# Patient Record
Sex: Female | Born: 1990 | Race: Black or African American | Hispanic: No | Marital: Single | Smoking: Current some day smoker
Health system: Southern US, Community
[De-identification: ages and names within clinical notes are randomized; demographics above are authoritative.]

## PROBLEM LIST (undated history)

## (undated) ENCOUNTER — Emergency Department (HOSPITAL_COMMUNITY): Admission: EM | Payer: Self-pay | Source: Home / Self Care

## (undated) DIAGNOSIS — E669 Obesity, unspecified: Secondary | ICD-10-CM

## (undated) DIAGNOSIS — G4733 Obstructive sleep apnea (adult) (pediatric): Secondary | ICD-10-CM

## (undated) DIAGNOSIS — R569 Unspecified convulsions: Secondary | ICD-10-CM

## (undated) DIAGNOSIS — E1169 Type 2 diabetes mellitus with other specified complication: Secondary | ICD-10-CM

## (undated) HISTORY — PX: ADENOIDECTOMY: SUR15

## (undated) HISTORY — PX: TONSILLECTOMY: SUR1361

---
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
## 2014-06-23 ENCOUNTER — Emergency Department (HOSPITAL_COMMUNITY)
Admission: EM | Admit: 2014-06-23 | Discharge: 2014-06-23 | Disposition: A | Discharge status: Home / Self Care | Payer: Managed Care, Other (non HMO) | Attending: Emergency Medicine | Admitting: Emergency Medicine

## 2014-06-23 ENCOUNTER — Encounter (HOSPITAL_COMMUNITY): Payer: Self-pay | Admitting: Emergency Medicine

## 2014-06-23 DIAGNOSIS — R0602 Shortness of breath: Secondary | ICD-10-CM | POA: Insufficient documentation

## 2014-06-23 DIAGNOSIS — Z3202 Encounter for pregnancy test, result negative: Secondary | ICD-10-CM | POA: Insufficient documentation

## 2014-06-23 DIAGNOSIS — M546 Pain in thoracic spine: Secondary | ICD-10-CM | POA: Insufficient documentation

## 2014-06-23 DIAGNOSIS — M549 Dorsalgia, unspecified: Secondary | ICD-10-CM | POA: Diagnosis present

## 2014-06-23 LAB — URINALYSIS, ROUTINE W REFLEX MICROSCOPIC
Bilirubin Urine: NEGATIVE
Glucose, UA: NEGATIVE mg/dL
Hgb urine dipstick: NEGATIVE
Ketones, ur: NEGATIVE mg/dL
Leukocytes, UA: NEGATIVE
Nitrite: NEGATIVE
Protein, ur: NEGATIVE mg/dL
Specific Gravity, Urine: 1.018 (ref 1.005–1.030)
Urobilinogen, UA: 0.2 mg/dL (ref 0.0–1.0)
pH: 8 (ref 5.0–8.0)

## 2014-06-23 LAB — POC URINE PREG, ED: Preg Test, Ur: NEGATIVE

## 2014-06-23 MED ORDER — NAPROXEN 500 MG PO TABS: 500.0000 mg | ORAL_TABLET | Freq: Two times a day (BID) | ORAL | Status: DC

## 2014-06-23 MED ORDER — CYCLOBENZAPRINE HCL 10 MG PO TABS: 10.0000 mg | ORAL_TABLET | Freq: Three times a day (TID) | ORAL | Status: DC | PRN

## 2014-06-23 MED ORDER — CYCLOBENZAPRINE HCL 10 MG PO TABS
10.0000 mg | ORAL_TABLET | Freq: Once | ORAL | Status: AC
  Administered 2014-06-23: 10 mg via ORAL
  Filled 2014-06-23: qty 1

## 2014-06-23 NOTE — ED Notes (Signed)
Pt aware of urine sample needed, pt unable to go at this time. 

## 2014-06-23 NOTE — ED Notes (Signed)
Pt sts mid to lower back pain x 2 days that is worse with movement

## 2014-06-23 NOTE — ED Provider Notes (Signed)
CSN: [id number]     Arrival date & time 06/23/14  1756 History  This chart was scribed for Clayton Bibles, PA-C working with Carmin Muskrat, MD by Randa Evens, ED Scribe. This patient was seen in room TR09C/TR09C and the patient's care was started at 6:57 PM.    Chief Complaint  Patient presents with  . Back Pain   The history is provided by the patient. No language interpreter was used.   HPI Comments: Alicia Becker is a 24 y.o. female who presents to the Emergency Department complaining of sharp back pain onset 2 days prior. Pt states that she thought the pain was from over working. Pt states that she has tried a heating pad with no relief. Pt has also tried an epsom salt bath as well with no relief. Pt states that the pain is worse with movement and certain positions. Pt denies fall, heavy lifting or inujy Denies fever, CP cough, abdominal pain, dysuria, urinary frequency, hematuria, vaginal bleeding, vaginal discharge, numbness, weakness or constipation. Denies Hx of cancer or Iv drug use. Pt does report intermittent SOB but relates this to being so overweight.  LMP 05/06/14.  Reports allergy to ibuprofen, tramadol.    History reviewed. No pertinent past medical history. History reviewed. No pertinent past surgical history. History reviewed. No pertinent family history. History  Substance Use Topics  . Smoking status: Never Smoker   . Smokeless tobacco: Not on file  . Alcohol Use: Yes     Comment: occ   OB History    No data available      Review of Systems  Constitutional: Negative for fever.  Respiratory: Negative for cough.   Cardiovascular: Negative for chest pain.  Gastrointestinal: Negative for abdominal pain and constipation.  Genitourinary: Negative for dysuria, frequency, hematuria, vaginal bleeding and vaginal discharge.  Musculoskeletal: Positive for back pain.  Neurological: Negative for weakness and numbness.     Allergies  Review of patient's allergies  indicates no known allergies.  Home Medications   Prior to Admission medications   Not on File   BP 146/91 mmHg  Pulse 110  Temp(Src) 98.7 F (37.1 C) (Oral)  Resp 16  Ht 5\' 3"  (1.6 m)  Wt 305 lb 9.6 oz (138.619 kg)  BMI 54.15 kg/m2  SpO2 99%   Physical Exam  Constitutional: She appears well-developed and well-nourished. No distress.  Morbidly obese.   HENT:  Head: Normocephalic and atraumatic.  Neck: Neck supple.  Pulmonary/Chest: Effort normal.  Abdominal: Soft. She exhibits no distension and no mass. There is no tenderness. There is no rebound and no guarding.  Musculoskeletal:  Spine nontender, no crepitus, or stepoffs. Lower extremities:  Strength 5/5, sensation intact, distal pulses intact.     Neurological: She is alert.  Skin: She is not diaphoretic.  Nursing note and vitals reviewed.   ED Course  Procedures (including critical care time) DIAGNOSTIC STUDIES: Oxygen Saturation is 99% on RA, normal by my interpretation.    COORDINATION OF CARE: 7:23 PM-Discussed treatment plan with pt at bedside and pt agreed to plan.     Labs Review Labs Reviewed  URINALYSIS, ROUTINE W REFLEX MICROSCOPIC (NOT AT [facility])  POC URINE PREG, ED    Imaging Review No results found.   EKG Interpretation None      MDM   Final diagnoses:  Bilateral thoracic back pain   Afebrile, nontoxic patient with mechanical back pain. No red flags.  UA negative.  Upreg negative.  D/C home flexeril, naprosyn.  Discussed result, findings, treatment, and follow up  with patient.  Pt given return precautions.  Pt verbalizes understanding and agrees with plan.       I personally performed the services described in this documentation, which was scribed in my presence. The recorded information has been reviewed and is accurate.      Clayton Bibles, PA-C 06/23/14 2116  Carmin Muskrat, MD 06/24/14 [phone]

## 2014-06-23 NOTE — Discharge Instructions (Signed)
Read the information below.  Use the prescribed medication as directed.  Please discuss all new medications with your pharmacist.  You may return to the Emergency Department at any time for worsening condition or any new symptoms that concern you.   If you develop fevers, loss of control of bowel or bladder, weakness or numbness in your legs, or are unable to walk, return to the ER for a recheck.    Back Pain, Adult Low back pain is very common. About 1 in 5 people have back pain.The cause of low back pain is rarely dangerous. The pain often gets better over time.About half of people with a sudden onset of back pain feel better in just 2 weeks. About 8 in 10 people feel better by 6 weeks.  CAUSES Some common causes of back pain include:  Strain of the muscles or ligaments supporting the spine.  Wear and tear (degeneration) of the spinal discs.  Arthritis.  Direct injury to the back. DIAGNOSIS Most of the time, the direct cause of low back pain is not known.However, back pain can be treated effectively even when the exact cause of the pain is unknown.Answering your caregiver's questions about your overall health and symptoms is one of the most accurate ways to make sure the cause of your pain is not dangerous. If your caregiver needs more information, he or she may order lab work or imaging tests (X-rays or MRIs).However, even if imaging tests show changes in your back, this usually does not require surgery. HOME CARE INSTRUCTIONS For many people, back pain returns.Since low back pain is rarely dangerous, it is often a condition that people can learn to [facility] their own.   Remain active. It is stressful on the back to sit or stand in one place. Do not sit, drive, or stand in one place for more than 30 minutes at a time. Take short walks on level surfaces as soon as pain allows.Try to increase the length of time you walk each day.  Do not stay in bed.Resting more than 1 or 2 days can  delay your recovery.  Do not avoid exercise or work.Your body is made to move.It is not dangerous to be active, even though your back may hurt.Your back will likely heal faster if you return to being active before your pain is gone.  Pay attention to your body when you bend and lift. Many people have less discomfortwhen lifting if they bend their knees, keep the load close to their bodies,and avoid twisting. Often, the most comfortable positions are those that put less stress on your recovering back.  Find a comfortable position to sleep. Use a firm mattress and lie on your side with your knees slightly bent. If you lie on your back, put a pillow under your knees.  Only take over-the-counter or prescription medicines as directed by your caregiver. Over-the-counter medicines to reduce pain and inflammation are often the most helpful.Your caregiver may prescribe muscle relaxant drugs.These medicines help dull your pain so you can more quickly return to your normal activities and healthy exercise.  Put ice on the injured area.  Put ice in a plastic bag.  Place a towel between your skin and the bag.  Leave the ice on for 15-20 minutes, 03-04 times a day for the first 2 to 3 days. After that, ice and heat may be alternated to reduce pain and spasms.  Ask your caregiver about trying back exercises and gentle massage. This may be of some  benefit.  Avoid feeling anxious or stressed.Stress increases muscle tension and can worsen back pain.It is important to recognize when you are anxious or stressed and learn ways to manage it.Exercise is a great option. SEEK MEDICAL CARE IF:  You have pain that is not relieved with rest or medicine.  You have pain that does not improve in 1 week.  You have new symptoms.  You are generally not feeling well. SEEK IMMEDIATE MEDICAL CARE IF:   You have pain that radiates from your back into your legs.  You develop new bowel or bladder control  problems.  You have unusual weakness or numbness in your arms or legs.  You develop nausea or vomiting.  You develop abdominal pain.  You feel faint. Document Released: 12/19/2004 Document Revised: 06/20/2011 Document Reviewed: 04/22/2013 [facility] Patient Information 2015 [place], [place]. This information is not intended to replace advice given to you by your health care provider. Make sure you discuss any questions you have with your health care provider.  Back Exercises Back exercises help treat and prevent back injuries. The goal of back exercises is to increase the strength of your abdominal and back muscles and the flexibility of your back. These exercises should be started when you no longer have back pain. Back exercises include:  Pelvic Tilt. Lie on your back with your knees bent. Tilt your pelvis until the lower part of your back is against the floor. Hold this position 5 to 10 sec and repeat 5 to 10 times.  Knee to Chest. Pull first 1 knee up against your chest and hold for 20 to 30 seconds, repeat this with the other knee, and then both knees. This may be done with the other leg straight or bent, whichever feels better.  Sit-Ups or Curl-Ups. Bend your knees 90 degrees. Start with tilting your pelvis, and do a partial, slow sit-up, lifting your trunk only 30 to 45 degrees off the floor. Take at least 2 to 3 seconds for each sit-up. Do not do sit-ups with your knees out straight. If partial sit-ups are difficult, simply do the above but with only tightening your abdominal muscles and holding it as directed.  Hip-Lift. Lie on your back with your knees flexed 90 degrees. Push down with your feet and shoulders as you raise your hips a couple inches off the floor; hold for 10 seconds, repeat 5 to 10 times.  Back arches. Lie on your stomach, propping yourself up on bent elbows. Slowly press on your hands, causing an arch in your low back. Repeat 3 to 5 times. Any initial stiffness and  discomfort should lessen with repetition over time.  Shoulder-Lifts. Lie face down with arms beside your body. Keep hips and torso pressed to floor as you slowly lift your head and shoulders off the floor. Do not overdo your exercises, especially in the beginning. Exercises may cause you some mild back discomfort which lasts for a few minutes; however, if the pain is more severe, or lasts for more than 15 minutes, do not continue exercises until you see your caregiver. Improvement with exercise therapy for back problems is slow.  See your caregivers for assistance with developing a proper back exercise program. Document Released: 01/27/2004 Document Revised: 03/13/2011 Document Reviewed: 10/20/2010 [facility] Patient Information 2015 [place], [place]. This information is not intended to replace advice given to you by your health care provider. Make sure you discuss any questions you have with your health care provider.

## 2015-05-27 ENCOUNTER — Encounter (HOSPITAL_COMMUNITY): Payer: Self-pay | Admitting: Emergency Medicine

## 2015-05-27 ENCOUNTER — Emergency Department (HOSPITAL_COMMUNITY)
Admission: EM | Admit: 2015-05-27 | Discharge: 2015-05-27 | Disposition: A | Discharge status: Home / Self Care | Payer: BLUE CROSS/BLUE SHIELD | Attending: Emergency Medicine | Admitting: Emergency Medicine

## 2015-05-27 DIAGNOSIS — K0889 Other specified disorders of teeth and supporting structures: Secondary | ICD-10-CM | POA: Insufficient documentation

## 2015-05-27 MED ORDER — OXYCODONE-ACETAMINOPHEN 5-325 MG PO TABS
1.0000 | ORAL_TABLET | Freq: Once | ORAL | Status: AC
  Administered 2015-05-27: 1 via ORAL
  Filled 2015-05-27: qty 1

## 2015-05-27 MED ORDER — PENICILLIN V POTASSIUM 500 MG PO TABS: 500.0000 mg | ORAL_TABLET | Freq: Three times a day (TID) | ORAL | Status: DC

## 2015-05-27 MED ORDER — HYDROCODONE-ACETAMINOPHEN 5-325 MG PO TABS: 1.0000 | ORAL_TABLET | Freq: Four times a day (QID) | ORAL | Status: DC | PRN

## 2015-05-27 NOTE — ED Notes (Signed)
[facility] (5-17) and they referred her to a dentist; however, pt does not have dental insurance.  She was told that she needed an antibiotic but the [facility] Did not give her any medication and told her to take ibuprofen for pain.

## 2015-05-27 NOTE — Discharge Instructions (Signed)
Take penicillin as prescribed until all gone for the infection. Take norco for severe pain. Follow up with a dentist as referred.   Dental Pain Dental pain may be caused by many things, including:  Tooth decay (cavities or caries). Cavities expose the nerve of your tooth to air and hot or cold temperatures. This can cause pain or discomfort.  Abscess or infection. A dental abscess is a collection of infected pus from a bacterial infection in the inner part of the tooth (pulp). It usually occurs at the end of the tooth's root.  Injury.  An unknown reason (idiopathic). Your pain may be mild or severe. It may only occur when:  You are chewing.  You are exposed to hot or cold temperature.  You are eating or drinking sugary foods or beverages, such as soda or candy. Your pain may also be constant. HOME CARE INSTRUCTIONS Watch your dental pain for any changes. The following actions may help to lessen any discomfort that you are feeling:  Take medicines only as directed by your dentist.  If you were prescribed an antibiotic medicine, finish all of it even if you start to feel better.  Keep all follow-up visits as directed by your dentist. This is important.  Do not apply heat to the outside of your face.  Rinse your mouth or gargle with salt water if directed by your dentist. This helps with pain and swelling.  You can make salt water by adding  tsp of salt to 1 cup of warm water.  Apply ice to the painful area of your face:  Put ice in a plastic bag.  Place a towel between your skin and the bag.  Leave the ice on for 20 minutes, 2-3 times per day.  Avoid foods or drinks that cause you pain, such as:  Very hot or very cold foods or drinks.  Sweet or sugary foods or drinks. SEEK MEDICAL CARE IF:  Your pain is not controlled with medicines.  Your symptoms are worse.  You have new symptoms. SEEK IMMEDIATE MEDICAL CARE IF:  You are unable to open your mouth.  You are  having trouble breathing or swallowing.  You have a fever.  Your face, neck, or jaw is swollen.   This information is not intended to replace advice given to you by your health care provider. Make sure you discuss any questions you have with your health care provider.   Document Released: 12/19/2004 Document Revised: 05/05/2014 Document Reviewed: 12/15/2013 Elsevier Interactive Patient Education [employer].

## 2015-05-27 NOTE — ED Provider Notes (Signed)
CSN: [id number]     Arrival date & time 05/27/15  1608 History  By signing my name below, I, Rowan Blase, attest that this documentation has been prepared under the direction and in the presence of non-physician practitioner, Jeannett Senior, PA-C. Electronically Signed: Rowan Blase, Scribe. 05/27/2015. 4:49 PM.   Chief Complaint  Patient presents with  . Dental Pain    The history is provided by the patient. No language interpreter was used.   HPI Comments:  Alicia Becker is a 25 y.o. female who presents to the Emergency Department complaining of lower right tooth pain. Pt reports associated difficulty eating and difficulty sleeping due to pain. [facility]. She was told she needed antibiotics and Ibuprofen; pt states they did not give her a prescription for antibiotics. She has taken Ibuprofen with mild relief, but it caused stomach upset. Pt does not have a PCP currently and is in the process of getting dental insurance.   History reviewed. No pertinent past medical history. History reviewed. No pertinent past surgical history. History reviewed. No pertinent family history. Social History  Substance Use Topics  . Smoking status: Never Smoker   . Smokeless tobacco: Never Used  . Alcohol Use: Yes     Comment: occ   OB History    No data available     Review of Systems  Constitutional: Negative for fever.  HENT: Positive for dental problem.     Allergies  Review of patient's allergies indicates no known allergies.  Home Medications   Prior to Admission medications   Medication Sig Start Date End Date Taking? Authorizing Provider  cyclobenzaprine (FLEXERIL) 10 MG tablet Take 1 tablet (10 mg total) by mouth 3 (three) times daily as needed for muscle spasms (or pain). 06/23/14   Clayton Bibles, PA-C  naproxen (NAPROSYN) 500 MG tablet Take 1 tablet (500 mg total) by mouth 2 (two) times daily with a meal. 06/23/14   Clayton Bibles, PA-C   BP 141/82 mmHg  Pulse 98  Temp(Src) 98.4 F (36.9 C) (Oral)  Resp 18  SpO2 100%  LMP 05/07/2015   Physical Exam  Constitutional: She appears well-developed and well-nourished. No distress.  HENT:  Head: Normocephalic and atraumatic.  Right lower first, second, third molars are broken off to the gumline. There is surrounding gum swelling. There is mild facial swelling as well. Exam consistent with a dental abscess. There is no trismus or swelling under the tongue.  Eyes: Right eye exhibits no discharge. Left eye exhibits no discharge.  Pulmonary/Chest: Effort normal. No respiratory distress.  Neurological: She is alert. Coordination normal.  Skin: No rash noted. She is not diaphoretic.  Psychiatric: She has a normal mood and affect. Her behavior is normal.  Nursing note and vitals reviewed.   ED Course  Procedures  DIAGNOSTIC STUDIES:  Oxygen Saturation is 100% on RA, normal by my interpretation.    COORDINATION OF CARE:  4:48 PM Will start pt on antibiotic and refer to on call dentist. Discussed treatment plan with pt at bedside and pt agreed to plan.  Labs Review Labs Reviewed - No data to display  Imaging Review No results found. I have personally reviewed and evaluated these images and lab results as part of my medical decision-making.   EKG Interpretation None      MDM   Final diagnoses:  Pain, dental   Pt with dental pain. Most consistent with a possible dental  abscess. Will start on antibiotic. Pain medications -10 tab of norco, continue ibuprofen. Penicillin for infection until all gone. Follow up with a dentist.   Filed Vitals:   05/27/15 1621  BP: 141/82  Pulse: 98  Temp: 98.4 F (36.9 C)  TempSrc: Oral  Resp: 18  SpO2: 100%    I personally performed the services described in this documentation, which was scribed in my presence. The recorded information has been reviewed and is accurate.   Jeannett Senior, PA-C 05/27/15  [place], MD 05/27/15 1735

## 2015-07-10 ENCOUNTER — Encounter (HOSPITAL_COMMUNITY): Payer: Self-pay | Admitting: Emergency Medicine

## 2015-07-10 ENCOUNTER — Emergency Department (HOSPITAL_COMMUNITY)
Admission: EM | Admit: 2015-07-10 | Discharge: 2015-07-10 | Disposition: A | Discharge status: Home / Self Care | Payer: BLUE CROSS/BLUE SHIELD | Attending: Emergency Medicine | Admitting: Emergency Medicine

## 2015-07-10 DIAGNOSIS — R51 Headache: Secondary | ICD-10-CM

## 2015-07-10 DIAGNOSIS — H9202 Otalgia, left ear: Secondary | ICD-10-CM

## 2015-07-10 DIAGNOSIS — B349 Viral infection, unspecified: Secondary | ICD-10-CM | POA: Diagnosis not present

## 2015-07-10 DIAGNOSIS — R519 Headache, unspecified: Secondary | ICD-10-CM

## 2015-07-10 MED ORDER — AMOXICILLIN 500 MG PO CAPS: 500.0000 mg | ORAL_CAPSULE | Freq: Three times a day (TID) | ORAL | Status: DC

## 2015-07-10 MED ORDER — GUAIFENESIN 100 MG/5ML PO LIQD: 100.0000 mg | ORAL | Status: DC | PRN

## 2015-07-10 MED ORDER — BUTALBITAL-APAP-CAFFEINE 50-325-40 MG PO TABS: 1.0000 | ORAL_TABLET | Freq: Four times a day (QID) | ORAL | Status: DC | PRN

## 2015-07-10 NOTE — ED Notes (Signed)
Pt reports congestion, sore throat, and headache for 2 weeks.

## 2015-07-10 NOTE — Discharge Instructions (Signed)
Your symptoms are likely caused by a virus.  However, there's a chance that you may also have a superimposed bacterial infection as well.  Please take amoxicillin as prescribed for the full duration as treatment.  Take Fioricet as needed for headache, take guaifenesin for cough and congestion.  Use number below to find a primary care provider for further management of your health.   Viral Infections A virus is a type of germ. Viruses can cause:  Minor sore throats.  Aches and pains.  Headaches.  Runny nose.  Rashes.  Watery eyes.  Tiredness.  Coughs.  Loss of appetite.  Feeling sick to your stomach (nausea).  Throwing up (vomiting).  Watery poop (diarrhea). HOME CARE   Only take medicines as told by your doctor.  Drink enough water and fluids to keep your pee (urine) clear or pale yellow. Sports drinks are a good choice.  Get plenty of rest and eat healthy. Soups and broths with crackers or rice are fine. GET HELP RIGHT AWAY IF:   You have a very bad headache.  You have shortness of breath.  You have chest pain or neck pain.  You have an unusual rash.  You cannot stop throwing up.  You have watery poop that does not stop.  You cannot keep fluids down.  You or your child has a temperature by mouth above 102 F (38.9 C), not controlled by medicine.  Your baby is older than 3 months with a rectal temperature of 102 F (38.9 C) or higher.  Your baby is 42 months old or younger with a rectal temperature of 100.4 F (38 C) or higher. MAKE SURE YOU:   Understand these instructions.  Will watch this condition.  Will get help right away if you are not doing well or get worse.   This information is not intended to replace advice given to you by your health care provider. Make sure you discuss any questions you have with your health care provider.   Document Released: 12/02/2007 Document Revised: 03/13/2011 Document Reviewed: 05/27/2014 Elsevier Interactive  Patient Education [employer].

## 2015-07-10 NOTE — ED Provider Notes (Signed)
CSN: [id number]     Arrival date & time 07/10/15  1106 History   First MD Initiated Contact with Patient 07/10/15 1136     Chief Complaint  Patient presents with  . Nasal Congestion     (Consider location/radiation/quality/duration/timing/severity/associated sxs/prior Treatment) HPI   25 year old female presents with cold sxs.  Patient states for the past 2 weeks she has been having recurrent frontal headache, sinus congestion, ear discomfort, sore throat, swollen uvula, generalized body aches, and not feeling well. She has been using numerous over-the-counter medication including TheraFlu, Alka-Seltzer, and other home remedy without adequate relief. Her headache is affecting her daily living. 2 days ago she reported having some nausea vomiting diarrhea which has since resolved. She does endorse a cough productive with yellow sputum. She denies any neck stiffness, chest pain, shortness of breath, or rash. She is a nonsmoker. Denies any recent sick contact.  History reviewed. No pertinent past medical history. History reviewed. No pertinent past surgical history. No family history on file. Social History  Substance Use Topics  . Smoking status: Never Smoker   . Smokeless tobacco: Never Used  . Alcohol Use: Yes     Comment: occ   OB History    No data available     Review of Systems  All other systems reviewed and are negative.     Allergies  Review of patient's allergies indicates no known allergies.  Home Medications   Prior to Admission medications   Medication Sig Start Date End Date Taking? Authorizing Provider  cyclobenzaprine (FLEXERIL) 10 MG tablet Take 1 tablet (10 mg total) by mouth 3 (three) times daily as needed for muscle spasms (or pain). 06/23/14   Clayton Bibles, PA-C  HYDROcodone-acetaminophen (NORCO) 5-325 MG tablet Take 1 tablet by mouth every 6 (six) hours as needed for moderate pain. 05/27/15   Tatyana Kirichenko, PA-C  naproxen (NAPROSYN) 500 MG tablet Take 1  tablet (500 mg total) by mouth 2 (two) times daily with a meal. 06/23/14   Clayton Bibles, PA-C  penicillin v potassium (VEETID) 500 MG tablet Take 1 tablet (500 mg total) by mouth 3 (three) times daily. 05/27/15   Tatyana Kirichenko, PA-C   BP 148/132 mmHg  Pulse 102  Temp(Src) 98.3 F (36.8 C) (Oral)  Resp 18  SpO2 99%  LMP 06/03/2015 Physical Exam  Constitutional: She appears well-developed and well-nourished. No distress.  HENT:  Head: Atraumatic.  Ears: Erythema noted to left TM but nonbulging. Right TM is normal Nose: Rhinorrhea with boggy turbinates Throat: Uvula is mildly edematous but midline. No tonsillar enlargement or exudates, no trismus  Eyes: Conjunctivae are normal.  Neck: Normal range of motion. Neck supple.  No nuchal rigidity  Cardiovascular: Normal rate and regular rhythm.   Pulmonary/Chest: Effort normal and breath sounds normal. She has no wheezes.  Abdominal: Soft.  Lymphadenopathy:    She has cervical adenopathy.  Neurological: She is alert.  Skin: No rash noted.  Psychiatric: She has a normal mood and affect.  Nursing note and vitals reviewed.   ED Course  Procedures (including critical care time) Labs Review Labs Reviewed - No data to display  Imaging Review No results found. I have personally reviewed and evaluated these images and lab results as part of my medical decision-making.   EKG Interpretation None      MDM   Final diagnoses:  Otalgia, left  Bad headache  Viral syndrome    BP 148/132 mmHg  Pulse 102  Temp(Src) 98.3 F (36.8 C) (  Oral)  Resp 18  SpO2 99%  LMP 06/03/2015   11:51 AM Patient here with cold symptoms likely viral in etiology however it has been ongoing for 2 weeks therefore I plan to treat for potential superimposed bacterial infection. Patient discharged with amoxicillin, Fioricet for headache, and guaifenesin for her congestion. I encouraged patient to find a primary care provider for further management of her  health.  Domenic Moras, PA-C 07/10/15 [place], MD 07/10/15 [phone]

## 2015-09-20 ENCOUNTER — Emergency Department (HOSPITAL_COMMUNITY): Payer: BLUE CROSS/BLUE SHIELD

## 2015-09-20 ENCOUNTER — Emergency Department (HOSPITAL_COMMUNITY)
Admission: EM | Admit: 2015-09-20 | Discharge: 2015-09-20 | Disposition: A | Discharge status: Home / Self Care | Payer: BLUE CROSS/BLUE SHIELD | Attending: Emergency Medicine | Admitting: Emergency Medicine

## 2015-09-20 ENCOUNTER — Encounter (HOSPITAL_COMMUNITY): Payer: Self-pay | Admitting: Vascular Surgery

## 2015-09-20 DIAGNOSIS — R079 Chest pain, unspecified: Secondary | ICD-10-CM | POA: Insufficient documentation

## 2015-09-20 DIAGNOSIS — Z5321 Procedure and treatment not carried out due to patient leaving prior to being seen by health care provider: Secondary | ICD-10-CM | POA: Insufficient documentation

## 2015-09-20 LAB — BASIC METABOLIC PANEL
Anion gap: 5 (ref 5–15)
BUN: 11 mg/dL (ref 6–20)
CO2: 30 mmol/L (ref 22–32)
Calcium: 9.8 mg/dL (ref 8.9–10.3)
Chloride: 102 mmol/L (ref 101–111)
Creatinine, Ser: 0.94 mg/dL (ref 0.44–1.00)
GFR calc non Af Amer: >60 mL/min (ref >60)
Glucose, Bld: 95 mg/dL (ref 65–99)
POTASSIUM: 4.2 mmol/L (ref 3.5–5.1)
SODIUM: 137 mmol/L (ref 135–145)

## 2015-09-20 LAB — CBC
HEMATOCRIT: 35.9 % — AB (ref 36.0–46.0)
Hemoglobin: 10.5 g/dL — ABNORMAL LOW (ref 12.0–15.0)
MCH: 22.1 pg — ABNORMAL LOW (ref 26.0–34.0)
MCHC: 29.2 g/dL — ABNORMAL LOW (ref 30.0–36.0)
MCV: 75.4 fL — AB (ref 78.0–100.0)
Platelets: 340 10*3/uL (ref 150–400)
RBC: 4.76 MIL/uL (ref 3.87–5.11)
RDW: 18.1 % — ABNORMAL HIGH (ref 11.5–15.5)
WBC: 8.2 10*3/uL (ref 4.0–10.5)

## 2015-09-20 LAB — I-STAT TROPONIN, ED: Troponin i, poc: 0 ng/mL (ref 0.00–0.08)

## 2015-09-20 NOTE — ED Triage Notes (Signed)
Pt reports to the ED for eval of CP and back pain. Pt reports she has been having this x 1 month. She reports she was dx with PNA and had to be admitted last time she had these symptoms. She has some associated SOB. Pt also reports dry cough and nasal congestion.

## 2015-09-20 NOTE — ED Notes (Signed)
Pts name called for a room no answer 

## 2016-02-19 ENCOUNTER — Emergency Department (HOSPITAL_COMMUNITY)
Admission: EM | Admit: 2016-02-19 | Discharge: 2016-02-19 | Disposition: A | Discharge status: Home / Self Care | Payer: BLUE CROSS/BLUE SHIELD | Attending: Emergency Medicine | Admitting: Emergency Medicine

## 2016-02-19 ENCOUNTER — Encounter (HOSPITAL_COMMUNITY): Payer: Self-pay | Admitting: *Deleted

## 2016-02-19 DIAGNOSIS — Z79899 Other long term (current) drug therapy: Secondary | ICD-10-CM | POA: Insufficient documentation

## 2016-02-19 DIAGNOSIS — L509 Urticaria, unspecified: Secondary | ICD-10-CM

## 2016-02-19 DIAGNOSIS — L500 Allergic urticaria: Secondary | ICD-10-CM | POA: Insufficient documentation

## 2016-02-19 DIAGNOSIS — K0889 Other specified disorders of teeth and supporting structures: Secondary | ICD-10-CM | POA: Insufficient documentation

## 2016-02-19 MED ORDER — FAMOTIDINE 20 MG PO TABS
40.0000 mg | ORAL_TABLET | Freq: Once | ORAL | Status: AC
  Administered 2016-02-19: 40 mg via ORAL
  Filled 2016-02-19: qty 2

## 2016-02-19 MED ORDER — DIPHENHYDRAMINE HCL 50 MG/ML IJ SOLN
50.0000 mg | Freq: Once | INTRAMUSCULAR | Status: AC
  Administered 2016-02-19: 50 mg via INTRAMUSCULAR
  Filled 2016-02-19: qty 1

## 2016-02-19 MED ORDER — METHYLPREDNISOLONE SODIUM SUCC 125 MG IJ SOLR
125.0000 mg | Freq: Once | INTRAMUSCULAR | Status: AC
  Administered 2016-02-19: 125 mg via INTRAMUSCULAR
  Filled 2016-02-19: qty 2

## 2016-02-19 MED ORDER — DIPHENHYDRAMINE HCL 25 MG PO CAPS: 50.0000 mg | ORAL_CAPSULE | Freq: Four times a day (QID) | ORAL | 0 refills | Status: DC | PRN

## 2016-02-19 MED ORDER — PREDNISONE 20 MG PO TABS: 60.0000 mg | ORAL_TABLET | Freq: Every day | ORAL | 0 refills | Status: DC

## 2016-02-19 MED ORDER — CLINDAMYCIN HCL 300 MG PO CAPS: 300.0000 mg | ORAL_CAPSULE | Freq: Three times a day (TID) | ORAL | 0 refills | Status: DC

## 2016-02-19 NOTE — Discharge Instructions (Signed)
Please stop your penicillin and hydrocodone as these may be causing you to have an allergic reaction. We are starting you on clindamycin which you can start later today for your dental pain. We have given outpatient follow-up with multiple dentists. You may alternate between Tylenol 1000 mg every 6 hours as needed for pain and ibuprofen 800 mg every 8 hours as needed for pain. Please take Benadryl 50 mg every 6 hours as needed for rash, itching. Please continue your steroids. You may start your steroids tomorrow morning as you have received a dose here in the emergency department.

## 2016-02-19 NOTE — ED Provider Notes (Signed)
TIME SEEN: 6:40 AM  CHIEF COMPLAINT: Urticaria  HPI: Pt is a 26 y.o. female who presents emergency room with hives that started last night. Patient reports she was recently started on hydrocodone liquid and penicillin for dental abscess. Denies any other new medications, soaps, lotions, detergents, foods, pet exposures. No wheezing, difficulty breathing, swallowing or speaking. No lip or tongue swelling. States she has had amoxicillin before without issues but has never had hydrocodone that she can remember.  ROS: See HPI Constitutional: no fever  Eyes: no drainage  ENT: no runny nose   Cardiovascular:  no chest pain  Resp: no SOB  GI: no vomiting GU: no dysuria Integumentary: no rash  Allergy: hives  Musculoskeletal: no leg swelling  Neurological: no slurred speech ROS otherwise negative  PAST MEDICAL HISTORY/PAST SURGICAL HISTORY:  No past medical history on file.  MEDICATIONS:  Prior to Admission medications   Medication Sig Start Date End Date Taking? Authorizing Provider  amoxicillin (AMOXIL) 500 MG capsule Take 1 capsule (500 mg total) by mouth 3 (three) times daily. 07/10/15   Domenic Moras, PA-C  butalbital-acetaminophen-caffeine (FIORICET) [phone] MG tablet Take 1-2 tablets by mouth every 6 (six) hours as needed for headache. 07/10/15 07/09/16  Domenic Moras, PA-C  cyclobenzaprine (FLEXERIL) 10 MG tablet Take 1 tablet (10 mg total) by mouth 3 (three) times daily as needed for muscle spasms (or pain). 06/23/14   Clayton Bibles, PA-C  guaiFENesin (ROBITUSSIN) 100 MG/5ML liquid Take 5-10 mLs (100-200 mg total) by mouth every 4 (four) hours as needed for cough or congestion. 07/10/15   Domenic Moras, PA-C  HYDROcodone-acetaminophen (NORCO) 5-325 MG tablet Take 1 tablet by mouth every 6 (six) hours as needed for moderate pain. 05/27/15   Tatyana Kirichenko, PA-C  naproxen (NAPROSYN) 500 MG tablet Take 1 tablet (500 mg total) by mouth 2 (two) times daily with a meal. 06/23/14   Clayton Bibles, PA-C   penicillin v potassium (VEETID) 500 MG tablet Take 1 tablet (500 mg total) by mouth 3 (three) times daily. 05/27/15   Tatyana Kirichenko, PA-C    ALLERGIES:  No Known Allergies  SOCIAL HISTORY:  Social History  Substance Use Topics  . Smoking status: Never Smoker  . Smokeless tobacco: Never Used  . Alcohol use Yes     Comment: occ    FAMILY HISTORY: No family history on file.  EXAM: BP 123/88 (BP Location: Left Arm)   Pulse 99   Resp 16   LMP 02/10/2016   SpO2 100%  CONSTITUTIONAL: Alert and oriented and responds appropriately to questions. Well-appearing; well-nourished HEAD: Normocephalic EYES: Conjunctivae clear, PERRL, EOMI ENT: normal nose; no rhinorrhea; moist mucous membranes; No pharyngeal erythema or petechiae, no tonsillar hypertrophy or exudate, no uvular deviation, no unilateral swelling, no trismus or drooling, no muffled voice, normal phonation, no stridor, multiple dental caries present, no drainable dental abscess noted, no Ludwig's angina, tongue sits flat in the bottom of the mouth, no angioedema, no facial erythema or warmth, no facial swelling; no pain with movement of the neck NECK: Supple, no meningismus, no nuchal rigidity, no LAD  CARD: RRR; S1 and S2 appreciated; no murmurs, no clicks, no rubs, no gallops RESP: Normal chest excursion without splinting or tachypnea; breath sounds clear and equal bilaterally; no wheezes, no rhonchi, no rales, no hypoxia or respiratory distress, speaking full sentences ABD/GI: Normal bowel sounds; non-distended; soft, non-tender, no rebound, no guarding, no peritoneal signs, no hepatosplenomegaly BACK:  The back appears normal and is non-tender to palpation,  there is no CVA tenderness EXT: Normal ROM in all joints; non-tender to palpation; no edema; normal capillary refill; no cyanosis, no calf tenderness or swelling    SKIN: Normal color for age and race; warm; diffuse urticaria, no petechia or purpura, no blisters or  desquamation, no rash involving her palms are mucous membranes NEURO: Moves all extremities equally, sensation to light touch intact diffusely, cranial nerves II through XII intact, normal speech PSYCH: The patient's mood and manner are appropriate. Grooming and personal hygiene are appropriate.  MEDICAL DECISION MAKING: Patient here with diffuse urticaria but no other sign of allergic reaction. No difficulty breathing, swallowing or speaking. No hypotension. We'll treat symptomatically with Benadryl, steroids, Pepcid. We'll discharge home with prescriptions for the same. Also complaining of dental abscess. Has multiple dental caries but no drainable abscess or Ludwig's angina. Have advised her to avoid penicillin and hydrocodone and will transition her to clindamycin. Recommend alternating Tylenol and ibuprofen for pain and will provide with PCP and dental follow-up. Discussed return precautions. She is comfortable with this plan.  At this time, I do not feel there is any life-threatening condition present. I have reviewed and discussed all results (EKG, imaging, lab, urine as appropriate) and exam findings with patient/family. I have reviewed nursing notes and appropriate previous records.  I feel the patient is safe to be discharged home without further emergent workup and can continue workup as an outpatient as needed. Discussed usual and customary return precautions. Patient/family verbalize understanding and are comfortable with this plan.  Outpatient follow-up has been provided. All questions have been answered.      [place], DO 02/19/16 [phone]

## 2016-02-19 NOTE — ED Triage Notes (Signed)
Patient with rash all over body hives all over body.  The only thing new is Hydrocodon liquid given by her MD

## 2016-04-25 ENCOUNTER — Encounter (HOSPITAL_COMMUNITY): Payer: Self-pay | Admitting: Emergency Medicine

## 2016-04-25 ENCOUNTER — Emergency Department (HOSPITAL_COMMUNITY): Payer: BLUE CROSS/BLUE SHIELD

## 2016-04-25 ENCOUNTER — Emergency Department (HOSPITAL_COMMUNITY)
Admission: EM | Admit: 2016-04-25 | Discharge: 2016-04-25 | Disposition: A | Discharge status: Home / Self Care | Payer: BLUE CROSS/BLUE SHIELD | Attending: Emergency Medicine | Admitting: Emergency Medicine

## 2016-04-25 DIAGNOSIS — S8252XA Displaced fracture of medial malleolus of left tibia, initial encounter for closed fracture: Secondary | ICD-10-CM | POA: Diagnosis not present

## 2016-04-25 DIAGNOSIS — Y999 Unspecified external cause status: Secondary | ICD-10-CM | POA: Diagnosis not present

## 2016-04-25 DIAGNOSIS — Z79899 Other long term (current) drug therapy: Secondary | ICD-10-CM | POA: Diagnosis not present

## 2016-04-25 DIAGNOSIS — Y92410 Unspecified street and highway as the place of occurrence of the external cause: Secondary | ICD-10-CM | POA: Insufficient documentation

## 2016-04-25 DIAGNOSIS — Y939 Activity, unspecified: Secondary | ICD-10-CM | POA: Diagnosis not present

## 2016-04-25 DIAGNOSIS — S99912A Unspecified injury of left ankle, initial encounter: Secondary | ICD-10-CM | POA: Diagnosis present

## 2016-04-25 DIAGNOSIS — S82892A Other fracture of left lower leg, initial encounter for closed fracture: Secondary | ICD-10-CM

## 2016-04-25 MED ORDER — DICLOFENAC SODIUM 50 MG PO TBEC: 50.0000 mg | DELAYED_RELEASE_TABLET | Freq: Two times a day (BID) | ORAL | 0 refills | Status: DC

## 2016-04-25 MED ORDER — OXYCODONE-ACETAMINOPHEN 5-325 MG PO TABS
1.0000 | ORAL_TABLET | Freq: Once | ORAL | Status: AC
  Administered 2016-04-25: 1 via ORAL
  Filled 2016-04-25: qty 1

## 2016-04-25 MED ORDER — HYDROCODONE-ACETAMINOPHEN 5-325 MG PO TABS: 1.0000 | ORAL_TABLET | Freq: Four times a day (QID) | ORAL | 0 refills | Status: DC | PRN
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
## 2016-04-25 NOTE — Progress Notes (Signed)
Orthopedic Tech Progress Note Patient Details:  Alicia Becker 1990-10-17 [id number]  Ortho Devices Type of Ortho Device: CAM walker Ortho Device/Splint Location: LLE Ortho Device/Splint Interventions: Ordered, Application   Braulio Bosch 04/25/2016, 10:59 PM

## 2016-04-25 NOTE — ED Notes (Signed)
Patient transported to X-ray 

## 2016-05-02 ENCOUNTER — Encounter (HOSPITAL_COMMUNITY): Payer: Self-pay | Admitting: Emergency Medicine

## 2016-05-02 ENCOUNTER — Emergency Department (HOSPITAL_COMMUNITY)
Admission: EM | Admit: 2016-05-02 | Discharge: 2016-05-02 | Disposition: A | Discharge status: Home / Self Care | Payer: BLUE CROSS/BLUE SHIELD | Attending: Physician Assistant | Admitting: Physician Assistant

## 2016-05-02 DIAGNOSIS — Z79899 Other long term (current) drug therapy: Secondary | ICD-10-CM | POA: Diagnosis not present

## 2016-05-02 DIAGNOSIS — L02414 Cutaneous abscess of left upper limb: Secondary | ICD-10-CM | POA: Insufficient documentation

## 2016-05-02 DIAGNOSIS — L02412 Cutaneous abscess of left axilla: Secondary | ICD-10-CM

## 2016-05-02 MED ORDER — SULFAMETHOXAZOLE-TRIMETHOPRIM 800-160 MG PO TABS: 1.0000 | ORAL_TABLET | Freq: Two times a day (BID) | ORAL | 0 refills | Status: AC

## 2016-05-02 MED ORDER — LIDOCAINE-EPINEPHRINE (PF) 2 %-1:200000 IJ SOLN
20.0000 mL | Freq: Once | INTRAMUSCULAR | Status: AC
  Administered 2016-05-02: 20 mL
  Filled 2016-05-02: qty 20

## 2016-05-02 MED ORDER — IBUPROFEN 800 MG PO TABS: 800.0000 mg | ORAL_TABLET | Freq: Three times a day (TID) | ORAL | 0 refills | Status: DC

## 2016-05-02 NOTE — ED Provider Notes (Signed)
[facility] DEPT Provider Note   CSN: [id number] Arrival date & time: 05/02/16  1237  By signing my name below, I, [place], attest that this documentation has been prepared under the direction and in the presence of Lorin Glass, PA-C. Electronically Signed: Ethelle Lyon Long, Scribe. 05/02/2016. 1:24 PM.  History   Chief Complaint Chief Complaint  Patient presents with  . Abscess   The history is provided by the patient and medical records. No language interpreter was used.   HPI Comments: Alicia Becker is a 26 y.o. female with no pertinent PMHx, who presents to the Emergency Department complaining of a moderate, gradually worsening area of pain and swelling to the left axilla onset one week ago. Pt reports this area has gradually worsened over the past week with the area "busting open" spontaneously two days ago. The area has had purulent drainage followed by clear/ bloody drainage for the past two days. She reports a h/o abscess to the same area after using a deodorant that caused her irritation. She did not try anything for relief of her symptoms PTA. No h/o CAD/MI or other significant coronary events. Pt states pain is exacerbated with palpation and direct pressure. Denies fever, chills. Pt is a nonsmoker and does not currently have a PCP.    History reviewed. No pertinent past medical history.  There are no active problems to display for this patient.  History reviewed. No pertinent surgical history.  OB History    No data available     Home Medications    Prior to Admission medications   Medication Sig Start Date End Date Taking? Authorizing Provider  diclofenac (VOLTAREN) 50 MG EC tablet Take 1 tablet (50 mg total) by mouth 2 (two) times daily. 04/25/16   Hope Bunnie Pion, NP  diphenhydrAMINE (BENADRYL) 25 mg capsule Take 2 capsules (50 mg total) by mouth every 6 (six) hours as needed. 02/19/16   Kristen N Ward, DO  HYDROcodone-acetaminophen (NORCO) 5-325 MG tablet  Take 1 tablet by mouth every 6 (six) hours as needed. 04/25/16   Hope Bunnie Pion, NP  ibuprofen (ADVIL,MOTRIN) 800 MG tablet Take 1 tablet (800 mg total) by mouth 3 (three) times daily. 05/02/16   Lorin Glass, PA-C  predniSONE (DELTASONE) 20 MG tablet Take 3 tablets (60 mg total) by mouth daily. 02/19/16   Kristen N Ward, DO  sulfamethoxazole-trimethoprim (BACTRIM DS,SEPTRA DS) 800-160 MG tablet Take 1 tablet by mouth 2 (two) times daily. 05/02/16 05/09/16  Lorin Glass, PA-C   Family History History reviewed. No pertinent family history.  Social History Social History  Substance Use Topics  . Smoking status: Never Smoker  . Smokeless tobacco: Never Used  . Alcohol use Yes     Comment: occ   Allergies   Patient has no known allergies.  Review of Systems Review of Systems  Constitutional: Negative for chills and fever.  Respiratory: Positive for shortness of breath.   Gastrointestinal: Negative for nausea and vomiting.  Skin: Positive for wound.  Neurological: Negative for numbness.    Physical Exam Updated Vital Signs BP (!) 129/109 (BP Location: Right Arm)   Pulse 92   Temp 98.1 F (36.7 C) (Oral)   Resp 16   LMP 04/18/2016 (Approximate)   SpO2 98%   Physical Exam  Constitutional: She is oriented to person, place, and time. She appears well-developed and well-nourished.  HENT:  Head: Normocephalic.  Eyes: Conjunctivae are normal.  Cardiovascular: Normal rate.   Pulmonary/Chest: Effort  normal and breath sounds normal. No respiratory distress.  Abdominal: She exhibits no distension.  Musculoskeletal: Normal range of motion.  Neurological: She is alert and oriented to person, place, and time.  Skin: Skin is warm and dry. There is erythema.  2cm open wound with red beefy wound bed. Inferior to that, she has a warm tender 1.5cm area of firmness with surrounding fluctuance consistent with abscess. She has multiple subdermal masses that are consistent with noninfected/  nonirritated cysts.  Exam during Incision and drainage after local infiltration of anesthesia revealed that the second, smaller abscess was in fact a loculated tract that had walled off from the initial abscess.     Psychiatric: She has a normal mood and affect.  Nursing note and vitals reviewed.  When entering patient room she was napping, noted loud snoring, easily arousable.  ED Treatments / Results  DIAGNOSTIC STUDIES:  Oxygen Saturation is 99% on RA, normal by my interpretation.    COORDINATION OF CARE:  1:24 PM Discussed treatment plan with pt at bedside including I&D and pt agreed to plan.  Labs (all labs ordered are listed, but only abnormal results are displayed) Labs Reviewed - No data to display  EKG  EKG Interpretation None       Radiology No results found.  Procedures .Marland KitchenIncision and Drainage Date/Time: 05/02/2016 3:13 PM Performed by: Lorin Glass Authorized by: Lorin Glass   Consent:    Consent obtained:  Verbal   Consent given by:  Patient   Risks discussed:  Bleeding, pain, incomplete drainage and infection   Alternatives discussed:  No treatment, delayed treatment, alternative treatment and referral Location:    Type:  Abscess   Size:  1.5cm   Location:  Upper extremity   Upper extremity location:  Arm   Arm location:  L upper arm Pre-procedure details:    Skin preparation:  Chloraprep Anesthesia (see MAR for exact dosages):    Anesthesia method:  Topical application and local infiltration   Local anesthetic:  Lidocaine 2% WITH epi Procedure type:    Complexity:  Complex Procedure details:    Incision types:  Stab incision   Incision depth:  Subcutaneous   Scalpel blade:  11   Wound management:  Irrigated with saline   Drainage:  Purulent and bloody   Drainage amount:  Moderate   Wound treatment:  Wound left open   Packing materials:  None Post-procedure details:    Patient tolerance of procedure:  Tolerated well, no  immediate complications        Medications Ordered in ED Medications  lidocaine-EPINEPHrine (XYLOCAINE W/EPI) 2 %-1:200000 (PF) injection 20 mL (20 mLs Infiltration Given 05/02/16 1520)     Initial Impression / Assessment and Plan / ED Course  I have reviewed the triage vital signs and the nursing notes.  Pertinent labs & imaging results that were available during my care of the patient were reviewed by me and considered in my medical decision making (see chart for details).     Patient with skin abscess. Incision and drainage performed in the ED today.  Abscess was not large enough to warrant packing or drain placement. Wound recheck in 2 days. Supportive care and return precautions discussed.  Pt sent home with Bactrim as there appear to be connected tracts/loculated abscesses.. The patient appears reasonably screened and/or stabilized for discharge and I doubt any other emergent medical condition requiring further screening, evaluation, or treatment in the ED prior to discharge.  Patient was advised  to discuss a outpatient sleep apnea evaluation with her PCP.   Final Clinical Impressions(s) / ED Diagnoses   Final diagnoses:  Abscess of axilla, left    New Prescriptions Discharge Medication List as of 05/02/2016  3:50 PM    START taking these medications   Details  ibuprofen (ADVIL,MOTRIN) 800 MG tablet Take 1 tablet (800 mg total) by mouth 3 (three) times daily., Starting Tue 05/02/2016, Print    sulfamethoxazole-trimethoprim (BACTRIM DS,SEPTRA DS) 800-160 MG tablet Take 1 tablet by mouth 2 (two) times daily., Starting Tue 05/02/2016, Until Tue 05/09/2016, Print        I personally performed the services described in this documentation, which was scribed in my presence. The recorded information has been reviewed and is accurate.     Lorin Glass, PA-C 05/02/16 [place], MD 05/02/16 1651

## 2016-05-02 NOTE — Discharge Instructions (Signed)
Please have a provider re-evaluate your wound in 48 hours.

## 2016-05-02 NOTE — ED Triage Notes (Signed)
Pt sts abscess to left axillary area with hx of same; pt sts some purulent drainage

## 2016-05-02 NOTE — ED Notes (Signed)
Pt stable, understands discharge instructions, and reasons for return.   

## 2016-05-27 ENCOUNTER — Encounter (HOSPITAL_COMMUNITY): Payer: Self-pay

## 2016-05-27 ENCOUNTER — Emergency Department (HOSPITAL_COMMUNITY)
Admission: EM | Admit: 2016-05-27 | Discharge: 2016-05-27 | Disposition: A | Discharge status: Home / Self Care | Payer: BLUE CROSS/BLUE SHIELD | Attending: Emergency Medicine | Admitting: Emergency Medicine

## 2016-05-27 DIAGNOSIS — S8252XA Displaced fracture of medial malleolus of left tibia, initial encounter for closed fracture: Secondary | ICD-10-CM | POA: Insufficient documentation

## 2016-05-27 DIAGNOSIS — Y929 Unspecified place or not applicable: Secondary | ICD-10-CM | POA: Diagnosis not present

## 2016-05-27 DIAGNOSIS — S8252XD Displaced fracture of medial malleolus of left tibia, subsequent encounter for closed fracture with routine healing: Secondary | ICD-10-CM

## 2016-05-27 DIAGNOSIS — Y999 Unspecified external cause status: Secondary | ICD-10-CM | POA: Diagnosis not present

## 2016-05-27 DIAGNOSIS — Y939 Activity, unspecified: Secondary | ICD-10-CM | POA: Diagnosis not present

## 2016-05-27 DIAGNOSIS — Z79899 Other long term (current) drug therapy: Secondary | ICD-10-CM | POA: Diagnosis not present

## 2016-05-27 DIAGNOSIS — M25572 Pain in left ankle and joints of left foot: Secondary | ICD-10-CM

## 2016-05-27 MED ORDER — MELOXICAM 15 MG PO TABS: 15.0000 mg | ORAL_TABLET | Freq: Every day | ORAL | 0 refills | Status: DC

## 2016-05-27 NOTE — Discharge Instructions (Addendum)
Wear your home ankle brace for stabilization of ankle. Alternate between Ice and heat throughout the day, using ice or heat pack for no more than 20 minutes every hour for each.  Elevate ankle throughout the day, as well.  Use mobic as directed; this takes the place of any other NSAIDs like ibuprofen or aleve, etc, so do not combine other NSAIDs with this medication. Use additional tylenol as needed for additional pain relief. Follow up with your orthopedic doctor in 1-2 weeks for recheck of symptoms and ongoing management of your ankle pain/injury. Return to the ER for changes or worsening symptoms.
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
## 2016-05-27 NOTE — ED Notes (Signed)
Bed: WTR7 Expected date:  Expected time:  Means of arrival:  Comments: 
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
## 2016-06-05 ENCOUNTER — Ambulatory Visit (INDEPENDENT_AMBULATORY_CARE_PROVIDER_SITE_OTHER): Payer: BLUE CROSS/BLUE SHIELD | Admitting: Neurology

## 2016-06-05 ENCOUNTER — Encounter: Payer: Self-pay | Admitting: Neurology

## 2016-06-05 ENCOUNTER — Encounter (INDEPENDENT_AMBULATORY_CARE_PROVIDER_SITE_OTHER): Payer: Self-pay

## 2016-06-05 VITALS — BP 143/86 | HR 86 | Ht 64.0 in | Wt 326.0 lb

## 2016-06-05 DIAGNOSIS — R351 Nocturia: Secondary | ICD-10-CM

## 2016-06-05 DIAGNOSIS — R519 Headache, unspecified: Secondary | ICD-10-CM

## 2016-06-05 DIAGNOSIS — R0981 Nasal congestion: Secondary | ICD-10-CM

## 2016-06-05 DIAGNOSIS — R400 Somnolence: Secondary | ICD-10-CM | POA: Diagnosis not present

## 2016-06-05 DIAGNOSIS — R51 Headache: Secondary | ICD-10-CM

## 2016-06-05 DIAGNOSIS — Z6841 Body Mass Index (BMI) 40.0 and over, adult: Secondary | ICD-10-CM

## 2016-06-05 DIAGNOSIS — G2581 Restless legs syndrome: Secondary | ICD-10-CM

## 2016-06-05 DIAGNOSIS — G4733 Obstructive sleep apnea (adult) (pediatric): Secondary | ICD-10-CM

## 2016-06-05 DIAGNOSIS — G4761 Periodic limb movement disorder: Secondary | ICD-10-CM

## 2016-06-05 NOTE — Patient Instructions (Addendum)
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
## 2016-06-23 ENCOUNTER — Ambulatory Visit (INDEPENDENT_AMBULATORY_CARE_PROVIDER_SITE_OTHER): Payer: BLUE CROSS/BLUE SHIELD | Admitting: Neurology

## 2016-06-23 DIAGNOSIS — G4733 Obstructive sleep apnea (adult) (pediatric): Secondary | ICD-10-CM

## 2016-06-23 DIAGNOSIS — G4720 Circadian rhythm sleep disorder, unspecified type: Secondary | ICD-10-CM

## 2016-06-25 ENCOUNTER — Encounter (HOSPITAL_COMMUNITY): Payer: Self-pay

## 2016-06-25 ENCOUNTER — Emergency Department (HOSPITAL_COMMUNITY)
Admission: EM | Admit: 2016-06-25 | Discharge: 2016-06-25 | Disposition: A | Discharge status: Home / Self Care | Payer: BLUE CROSS/BLUE SHIELD | Attending: Emergency Medicine | Admitting: Emergency Medicine

## 2016-06-25 ENCOUNTER — Emergency Department (HOSPITAL_COMMUNITY): Payer: BLUE CROSS/BLUE SHIELD

## 2016-06-25 DIAGNOSIS — Y9389 Activity, other specified: Secondary | ICD-10-CM | POA: Insufficient documentation

## 2016-06-25 DIAGNOSIS — S93402A Sprain of unspecified ligament of left ankle, initial encounter: Secondary | ICD-10-CM | POA: Insufficient documentation

## 2016-06-25 DIAGNOSIS — Y999 Unspecified external cause status: Secondary | ICD-10-CM | POA: Diagnosis not present

## 2016-06-25 DIAGNOSIS — W109XXA Fall (on) (from) unspecified stairs and steps, initial encounter: Secondary | ICD-10-CM | POA: Insufficient documentation

## 2016-06-25 DIAGNOSIS — S99912A Unspecified injury of left ankle, initial encounter: Secondary | ICD-10-CM | POA: Diagnosis present

## 2016-06-25 DIAGNOSIS — Y9289 Other specified places as the place of occurrence of the external cause: Secondary | ICD-10-CM | POA: Insufficient documentation

## 2016-06-25 MED ORDER — HYDROCODONE-ACETAMINOPHEN 5-325 MG PO TABS
1.0000 | ORAL_TABLET | Freq: Once | ORAL | Status: AC
  Administered 2016-06-25: 1 via ORAL
  Filled 2016-06-25: qty 1

## 2016-06-25 MED ORDER — NAPROXEN 500 MG PO TABS: 500.0000 mg | ORAL_TABLET | Freq: Two times a day (BID) | ORAL | 0 refills | Status: DC

## 2016-06-25 MED ORDER — NAPROXEN 250 MG PO TABS
500.0000 mg | ORAL_TABLET | Freq: Once | ORAL | Status: AC
  Administered 2016-06-25: 500 mg via ORAL
  Filled 2016-06-25: qty 2

## 2016-06-25 NOTE — Progress Notes (Signed)
Orthopedic Tech Progress Note Patient Details:  Alicia Becker 01/04/1990 [id number]  Ortho Devices Type of Ortho Device: ASO Ortho Device/Splint Location: LLE Ortho Device/Splint Interventions: Ordered, Application   Braulio Bosch 06/25/2016, 11:00 PM
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
## 2016-06-25 NOTE — ED Triage Notes (Signed)
Pt complaining of L ankle pain. Pt states fell down steps. Pt complaining of head and neck pain. Pt states unable to bare weight on L ankle. Pt denies any LOC.

## 2016-06-29 ENCOUNTER — Ambulatory Visit: Payer: Managed Care, Other (non HMO) | Admitting: Registered"

## 2016-07-03 ENCOUNTER — Telehealth: Payer: Self-pay | Admitting: Neurology

## 2016-07-03 NOTE — Telephone Encounter (Signed)
I called pt. She reports that she is waking up coughing and with nosebleeds every night. This makes her "paranoid" to go to sleep. She has not heard about her sleep study results but knows that Dr. Rexene Alberts has been out of the office for the past week. She insisted on an appt with Dr. Rexene Alberts, and the phone staff made her an appt tomorrow. Pt says that she wants to back to work, but cannot go back to work if she can't sleep. Pt did speak with her PCP regarding this, and her PCP recommended that she see Dr. Rexene Alberts for possible sleep meds. Pt says that she has "scabs" in her nose from these nosebleeds. I advised pt that I will speak to Dr. Rexene Alberts regarding her concerns and let her know of any further recommendations, other than the appt tomorrow. Pt verbalized understanding.

## 2016-07-03 NOTE — Telephone Encounter (Signed)
I called pt. I advised pt that Dr. Rexene Alberts reviewed their sleep study results and found that pt pt has severe osa. Dr. Rexene Alberts recommends that pt start a cpap. I reviewed PAP compliance expectations with the pt. Pt is agreeable to starting a CPAP. I advised pt that an order will be sent to a DME, [facility], and [facility] will call the pt within about one week after they file with the pt's insurance. [facility] will show the pt how to use the machine, fit for masks, and troubleshoot the CPAP if needed. A follow up appt was made for insurance purposes with Dr. Rexene Alberts on 09/06/2016. Pt verbalized understanding to arrive 15 minutes early and bring their CPAP. A letter with all of this information in it will be mailed to the pt as a reminder. I verified with the pt that the address we have on file is correct.   I asked pt to speak with Dr. Ernie Hew regarding an ENT eval for her nosebleeds. I recommended as well that we cancel the appt for tomorrow. Pt is agreeable to this. I explained to pt that Dr. Rexene Alberts cannot prescribe a sleep aid due to her osa at this time, but melatonin can be considered after pt is established on cpap.  Pt verbalized understanding of results. Pt had no questions at this time but was encouraged to call back if questions arise.

## 2016-07-03 NOTE — Telephone Encounter (Signed)
Please call patient back and advise her that her sleep study showed severe obstructive sleep apnea and that we will expedite her CPAP set up with a DME company. I would recommend that we reschedule tomorrow's appointment to about 10 weeks after CPAP set up. If she has nosebleeds she is advised to talk to her primary care physician about a referral to see ENT. I will not be able to prescribe a sleep aid due to her underlying obstructive sleep apnea which is in the severe range. Once she is established on CPAP she can try melatonin which is over-the-counter.

## 2016-07-03 NOTE — Procedures (Signed)
PATIENT'S NAME:  Alicia Becker, Alicia Becker DOB:      08/13/1990      MR#:    [id number]     DATE OF RECORDING: 06/23/2016 REFERRING M.D.:  Rachell Cipro, MD Study Performed:  Split-Night Titration Study HISTORY: 26 year old woman with a history of chronic rhinitis, left ankle pain, and morbid obesity with a BMI of over 50, who reports snoring and excessive daytime somnolence. The patient endorsed the Epworth Sleepiness Scale at 22 points. The patient's weight 326 pounds with a height of 64 (inches), resulting in a BMI of 55.7 kg/m2. The patient's neck circumference measured 15.5 inches.  CURRENT MEDICATIONS: Diclofenac, Benadryl, Flonase, Norco, Advil, Xyzal, Mobic, Deltasone  PROCEDURE:  This is a multichannel digital polysomnogram utilizing the Somnostar 11.2 system.  Electrodes and sensors were applied and monitored per AASM Specifications.   EEG, EOG, Chin and Limb EMG, were sampled at 200 Hz.  ECG, Snore and Nasal Pressure, Thermal Airflow, Respiratory Effort, CPAP Flow and Pressure, Oximetry was sampled at 50 Hz. Digital video and audio were recorded.      BASELINE STUDY WITHOUT CPAP RESULTS:  Lights Out was at 22:20 and Lights On at 05:13, split start at 01:04, epoch 337.  Total recording time (TRT) was 170, with a total sleep time (TST) of 123 minutes.   The patient's sleep latency was 39 minutes.  REM latency was 16.5 minutes.  The sleep efficiency was 72.4 %.    SLEEP ARCHITECTURE: WASO (Wake after sleep onset) was 17 minutes, Stage N1 was 1.5 minutes, Stage N2 was 104.5 minutes, Stage N3 was 13 minutes and Stage R (REM sleep) was 4 minutes.  The percentages were Stage N1 1.2%, Stage N2 85.%, which is increased, Stage N3 10.6% and Stage R (REM sleep) 3.3%.  The arousals were noted as: 20 were spontaneous, 0 were associated with PLMs, 170 were associated with respiratory events.   Audio and video analysis did not show any abnormal or unusual movements, behaviors, phonations or vocalizations.  The  patient took 1 bathroom break for the night. Mild to moderate snoring was noted The EKG was in keeping with normal sinus rhythm (NSR).   RESPIRATORY ANALYSIS:  There were a total of 175 respiratory events:  152 obstructive apneas, 0 central apneas and 13 mixed apneas with a total of 165 apneas and an apnea index (AI) of 80.5. There were 10 hypopneas with a hypopnea index of 4.9. The patient also had 0 respiratory event related arousals (RERAs).  Snoring was noted.     The total APNEA/HYPOPNEA INDEX (AHI) was 85.4 /hour and the total RESPIRATORY DISTURBANCE INDEX was 85.4 /hour.  7 events occurred in REM sleep and 33 events in NREM. The REM AHI was 105, /hour versus a non-REM AHI of 84.7 /hour. The patient spent 0 minutes sleep time in the supine position 343 minutes in non-supine. The supine AHI was n/a.   OXYGEN SATURATION & C02:  The wake baseline 02 saturation was 98%, with the lowest being 56%. Time spent below 89% saturation equaled 102 minutes.  PERIODIC LIMB MOVEMENTS: The patient had a total of 0 Periodic Limb Movements.  The Periodic Limb Movement (PLM) index was 0 /hour and the PLM Arousal index was 0 /hour.  TITRATION STUDY WITH CPAP RESULTS:   The patient was fitted with a Simplus FFM. CPAP was initiated at 5 cmH20 with heated humidity per AASM split night standards and pressure was advanced to 20 cmH20 because of hypopneas, apneas and desaturations.  At a  PAP pressure of 20 cmH20, there was a reduction of the AHI to 13.5/hour, non-supine REM sleep achieved and O2 nadir of 87% (80% was artifact).   Total recording time (TRT) was 243 minutes, with a total sleep time (TST) of 219.5 minutes. The patient's sleep latency was 9 minutes. REM latency was 114 minutes.  The sleep efficiency was 90.3 %.    SLEEP ARCHITECTURE: Wake after sleep was 20.5 minutes, Stage N1 0.5 minutes, Stage N2 126 minutes, Stage N3 50 minutes and Stage R (REM sleep) 43 minutes. The percentages were: Stage N1 .2%,  Stage N2 57.4%, Stage N3 22.8% and Stage R (REM sleep) 19.6%.   The arousals were noted as: 27 were spontaneous, 0 were associated with PLMs, 64 were associated with respiratory events.  RESPIRATORY ANALYSIS:  There were a total of 102 respiratory events: 42 obstructive apneas, 2 central apneas and 8 mixed apneas with a total of 52 apneas and an apnea index (AI) of 14.2. There were 50 hypopneas with a hypopnea index of 13.7 /hour. The patient also had 0 respiratory event related arousals (RERAs).      The total APNEA/HYPOPNEA INDEX  (AHI) was 27.9 /hour and the total RESPIRATORY DISTURBANCE INDEX was 27.9 /hour.  25 events occurred in REM sleep and 77 events in NREM. The REM AHI was 34.9 /hour versus a non-REM AHI of 26.2 /hour. REM sleep was achieved on a pressure of  cm/h2o (AHI was  .) The patient spent 0% of total sleep time in the supine position. The supine AHI was 0.0 /hour, versus a non-supine AHI of 27.9/hour.  OXYGEN SATURATION & C02:  The wake baseline 02 saturation was 98%, with the lowest being 66%. Time spent below 89% saturation equaled 48 minutes.  PERIODIC LIMB MOVEMENTS:  The patient had a total of 0 Periodic Limb Movements. The Periodic Limb Movement (PLM) index was 0 /hour and the PLM Arousal index was 0 /hour.  Post-study, the patient indicated that sleep was better than usual.  POLYSOMNOGRAPHY IMPRESSION :   1. Obstructive Sleep Apnea (OSA)  2. Dysfunctions associated with sleep stages or arousals from sleep   RECOMMENDATIONS:  1. This patient has severe obstructive sleep apnea and responded fairly well on CPAP therapy. I will, therefore, start the patient on home CPAP treatment at a pressure of 20 cm via small Simplus FFM, with heated humidity. The patient should be reminded to be fully compliant with PAP therapy to improve sleep related symptoms and decrease long term cardiovascular risks. Please note that untreated obstructive sleep apnea carries additional  perioperative morbidity. Patients with significant obstructive sleep apnea should receive perioperative PAP therapy and the surgeons and particularly the anesthesiologist should be informed of the diagnosis and the severity of the sleep disordered breathing. 2. If CPAP therapy in not enough to treat her OSA or not tolerated at this pressure, she will be advised to return for a BiPAP titration study.  3. This study shows sleep fragmentation and abnormal sleep stage percentages; these are nonspecific findings and per se do not signify an intrinsic sleep disorder or a cause for the patient's sleep-related symptoms. Causes include (but are not limited to) the first night effect of the sleep study, circadian rhythm disturbances, medication effect or an underlying mood disorder or medical problem.  4. The patient should be cautioned not to drive, work at heights, or operate dangerous or heavy equipment when tired or sleepy. Review and reiteration of good sleep hygiene measures should be pursued with  any patient. 5. The patient will be seen in follow-up by Dr. Rexene Alberts at [facility] for discussion of the test results and further management strategies. The referring provider will be notified of the test results.  I certify that I have reviewed the entire raw data recording prior to the issuance of this report in accordance with the Standards of Accreditation of the American Academy of Sleep Medicine (AASM)   Star Age, MD, PhD Diplomat, American Board of Psychiatry and Neurology (Neurology and Sleep Medicine)

## 2016-07-03 NOTE — Progress Notes (Signed)
Alicia Becker:  Patient referred by Dr. Ernie Hew, seen by me on 06/05/16, split study on 06/23/16.  Please call and notify patient that the recent sleep study confirmed the diagnosis of severe OSA. She did well with CPAP during the study with significant improvement of the respiratory events. Therefore, I would like start the patient on CPAP therapy at home by prescribing a machine for home use. I placed the order in the chart. The patient will need a follow up appointment with me in 8 to 10 weeks post set up that has to be scheduled; please go ahead and schedule while you have the patient on the phone and make sure patient understands the importance of keeping this window for the FU appointment, as it is often an insurance requirement and failing to adhere to this may result in losing coverage for sleep apnea treatment.  Please re-enforce the importance of compliance with treatment and the need for [place] to monitor compliance data - again an insurance requirement and good feedback for the patient as far as how they are doing.  Also remind patient, that any upcoming CPAP machine or mask issues, should be first addressed with the DME company. Please ask if patient has a preference regarding DME company.  Please reiterate that she will want to see ENT for her rhinitis, nasal congestion and recurrent nose bleeds.   Please arrange for CPAP set up at home through a DME company of patient's choice - once you have spoken to the patient - and faxed/routed report to PCP and referring MD (if other than PCP), you can close this encounter, thanks,   Star Age, MD, PhD [facility] ([place])

## 2016-07-03 NOTE — Addendum Note (Signed)
Addended by: Star Age on: 07/03/2016 07:07 PM   Modules accepted: Orders

## 2016-07-03 NOTE — Telephone Encounter (Signed)
Pt is asking a message be sent for Dr Rexene Alberts re: a personal matter about wanting to be seen(pt chose not to go into great detail but did stress it is of an urgent matter).  Pt made aware that we do not offer urgent care and to go to ED if needed, please call

## 2016-07-04 ENCOUNTER — Ambulatory Visit: Payer: BLUE CROSS/BLUE SHIELD | Admitting: Neurology

## 2016-07-05 ENCOUNTER — Encounter (HOSPITAL_COMMUNITY): Payer: Self-pay

## 2016-07-05 ENCOUNTER — Emergency Department (HOSPITAL_COMMUNITY): Payer: BLUE CROSS/BLUE SHIELD

## 2016-07-05 ENCOUNTER — Emergency Department (HOSPITAL_COMMUNITY)
Admission: EM | Admit: 2016-07-05 | Discharge: 2016-07-05 | Disposition: A | Discharge status: Home / Self Care | Payer: BLUE CROSS/BLUE SHIELD | Attending: Emergency Medicine | Admitting: Emergency Medicine

## 2016-07-05 DIAGNOSIS — M25562 Pain in left knee: Secondary | ICD-10-CM | POA: Diagnosis not present

## 2016-07-05 DIAGNOSIS — Z791 Long term (current) use of non-steroidal anti-inflammatories (NSAID): Secondary | ICD-10-CM | POA: Insufficient documentation

## 2016-07-05 DIAGNOSIS — M79672 Pain in left foot: Secondary | ICD-10-CM | POA: Insufficient documentation

## 2016-07-05 MED ORDER — OXYCODONE-ACETAMINOPHEN 5-325 MG PO TABS
2.0000 | ORAL_TABLET | Freq: Once | ORAL | Status: AC
  Administered 2016-07-05: 2 via ORAL
  Filled 2016-07-05: qty 2

## 2016-07-05 MED ORDER — IBUPROFEN 400 MG PO TABS: 400.0000 mg | ORAL_TABLET | Freq: Four times a day (QID) | ORAL | 0 refills | Status: DC | PRN

## 2016-07-05 MED ORDER — ONDANSETRON HCL 4 MG PO TABS
4.0000 mg | ORAL_TABLET | Freq: Once | ORAL | Status: AC
  Administered 2016-07-05: 4 mg via ORAL
  Filled 2016-07-05: qty 1

## 2016-07-05 MED ORDER — METHOCARBAMOL 750 MG PO TABS: 750.0000 mg | ORAL_TABLET | Freq: Four times a day (QID) | ORAL | 0 refills | Status: DC

## 2016-07-05 NOTE — Discharge Instructions (Signed)
Follow-up with your orthopedist.  °

## 2016-07-05 NOTE — ED Notes (Signed)
Patient transported to X-ray 
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
## 2016-07-05 NOTE — ED Triage Notes (Addendum)
PT C/O LEFT KNEE PAIN X2 WEEKS AFTER A FALL. PT ALSO C/O CONTINUED PAIN, SWELLING, AND NUMBNESS TO THE LEFT ANKLE. PT HAS BEEN SEEN BY AN ORTHOPEDIC DOCTOR AND PHYSICAL THERAPY WAS SET UP FOR THE END OF THIS MONTH. PT IS ALSO WAITING TO SEE HER PCP, BUT THE PAIN IS UNBEARABLE. PT STS HER KNEE WAS NOT CHECKED WHEN SHE INITIALLY FELL.

## 2016-09-06 ENCOUNTER — Ambulatory Visit: Payer: Self-pay | Admitting: Neurology

## 2016-09-12 ENCOUNTER — Encounter: Payer: Self-pay | Admitting: Neurology

## 2016-12-25 ENCOUNTER — Ambulatory Visit (HOSPITAL_COMMUNITY)
Admission: EM | Admit: 2016-12-25 | Discharge: 2016-12-25 | Disposition: A | Discharge status: Home / Self Care | Payer: BLUE CROSS/BLUE SHIELD

## 2016-12-25 NOTE — ED Notes (Signed)
Patient access told clinic staff this patient was leaving, but planning to return later

## 2017-01-12 ENCOUNTER — Encounter (HOSPITAL_COMMUNITY): Payer: Self-pay | Admitting: Emergency Medicine

## 2017-01-12 ENCOUNTER — Ambulatory Visit (HOSPITAL_COMMUNITY)
Admission: EM | Admit: 2017-01-12 | Discharge: 2017-01-12 | Disposition: A | Discharge status: Home / Self Care | Payer: Self-pay | Attending: Internal Medicine | Admitting: Internal Medicine

## 2017-01-12 ENCOUNTER — Other Ambulatory Visit: Payer: Self-pay

## 2017-01-12 DIAGNOSIS — J0110 Acute frontal sinusitis, unspecified: Secondary | ICD-10-CM

## 2017-01-12 MED ORDER — AMOXICILLIN-POT CLAVULANATE 875-125 MG PO TABS: 1.0000 | ORAL_TABLET | Freq: Two times a day (BID) | ORAL | 0 refills | Status: DC

## 2017-01-12 MED ORDER — FLUTICASONE PROPIONATE 50 MCG/ACT NA SUSP: 1.0000 | Freq: Every day | NASAL | 2 refills | Status: DC
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
## 2017-01-12 NOTE — ED Triage Notes (Signed)
The patient presented to the [facility] with a complaint of a cough and pain in her chest when she coughs x 1 week. The patient reported using OTC medications with minimal results.

## 2017-01-12 NOTE — Discharge Instructions (Signed)
Push fluids to ensure adequate hydration and keep secretions thin.  Tylenol and/or ibuprofen as needed for pain or fevers.   Complete course of antibiotics. Daily flonase. Nasal saline or nasal rinses may be helpful to clear congestion and help with pressure. If symptoms worsen or do not improve in the next week to return to be seen or to follow up with your PCP.

## 2017-02-08 ENCOUNTER — Telehealth: Payer: Self-pay

## 2017-02-08 NOTE — Telephone Encounter (Signed)
Received notice from [facility] that pt's cpap will need to be picked up because of non compliance. I called pt and offered her an appt with Dr. Rexene Alberts to discuss alternative treatments for her cpap. Pt is agreeable to an appt on 02/12/17 at 11:00am. Pt verbalized understanding of appt date and time.

## 2017-02-12 ENCOUNTER — Ambulatory Visit: Payer: Self-pay | Admitting: Neurology

## 2017-02-12 NOTE — Telephone Encounter (Signed)
Pt did not show for her appointment today with Dr. Rexene Alberts.

## 2017-02-15 ENCOUNTER — Encounter: Payer: Self-pay | Admitting: Neurology

## 2017-03-30 ENCOUNTER — Emergency Department (HOSPITAL_COMMUNITY)
Admission: EM | Admit: 2017-03-30 | Discharge: 2017-03-30 | Disposition: A | Discharge status: Home / Self Care | Payer: Self-pay | Attending: Emergency Medicine | Admitting: Emergency Medicine

## 2017-03-30 ENCOUNTER — Encounter (HOSPITAL_COMMUNITY): Payer: Self-pay | Admitting: *Deleted

## 2017-03-30 DIAGNOSIS — M25562 Pain in left knee: Secondary | ICD-10-CM | POA: Insufficient documentation

## 2017-03-30 DIAGNOSIS — M25561 Pain in right knee: Secondary | ICD-10-CM | POA: Insufficient documentation

## 2017-03-30 DIAGNOSIS — M545 Low back pain: Secondary | ICD-10-CM | POA: Insufficient documentation

## 2017-03-30 MED ORDER — METHOCARBAMOL 750 MG PO TABS: 750.0000 mg | ORAL_TABLET | Freq: Four times a day (QID) | ORAL | 0 refills | Status: DC
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
## 2017-03-30 NOTE — ED Triage Notes (Signed)
Pt complains of bilateral knee pain for the past few months, states it has become harder to walk. Pt also complains of upper and lower back pain since falling off a step ladder last week.

## 2017-05-01 ENCOUNTER — Other Ambulatory Visit: Payer: Self-pay

## 2017-05-01 ENCOUNTER — Emergency Department (HOSPITAL_COMMUNITY)
Admission: EM | Admit: 2017-05-01 | Discharge: 2017-05-01 | Disposition: A | Discharge status: Home / Self Care | Payer: Self-pay | Attending: Emergency Medicine | Admitting: Emergency Medicine

## 2017-05-01 ENCOUNTER — Encounter (HOSPITAL_COMMUNITY): Payer: Self-pay | Admitting: Emergency Medicine

## 2017-05-01 ENCOUNTER — Emergency Department (HOSPITAL_COMMUNITY): Payer: Self-pay

## 2017-05-01 DIAGNOSIS — S0993XA Unspecified injury of face, initial encounter: Secondary | ICD-10-CM | POA: Insufficient documentation

## 2017-05-01 DIAGNOSIS — Y999 Unspecified external cause status: Secondary | ICD-10-CM | POA: Insufficient documentation

## 2017-05-01 DIAGNOSIS — G40909 Epilepsy, unspecified, not intractable, without status epilepticus: Secondary | ICD-10-CM | POA: Insufficient documentation

## 2017-05-01 DIAGNOSIS — R569 Unspecified convulsions: Secondary | ICD-10-CM

## 2017-05-01 DIAGNOSIS — W19XXXA Unspecified fall, initial encounter: Secondary | ICD-10-CM | POA: Insufficient documentation

## 2017-05-01 DIAGNOSIS — Y9389 Activity, other specified: Secondary | ICD-10-CM | POA: Insufficient documentation

## 2017-05-01 DIAGNOSIS — Y92830 Public park as the place of occurrence of the external cause: Secondary | ICD-10-CM | POA: Insufficient documentation

## 2017-05-01 HISTORY — DX: Unspecified convulsions: R56.9

## 2017-05-01 LAB — RAPID URINE DRUG SCREEN, HOSP PERFORMED
AMPHETAMINES: NOT DETECTED
BENZODIAZEPINES: NOT DETECTED
Barbiturates: NOT DETECTED
COCAINE: NOT DETECTED
OPIATES: NOT DETECTED
TETRAHYDROCANNABINOL: POSITIVE — AB

## 2017-05-01 LAB — CBC WITH DIFFERENTIAL/PLATELET
BASOS PCT: 0 %
Basophils Absolute: 0 10*3/uL (ref 0.0–0.1)
Eosinophils Absolute: 0.2 10*3/uL (ref 0.0–0.7)
Eosinophils Relative: 3 %
HEMATOCRIT: 32.7 % — AB (ref 36.0–46.0)
HEMOGLOBIN: 9.8 g/dL — AB (ref 12.0–15.0)
LYMPHS ABS: 3 10*3/uL (ref 0.7–4.0)
Lymphocytes Relative: 41 %
MCH: 22.2 pg — AB (ref 26.0–34.0)
MCHC: 30 g/dL (ref 30.0–36.0)
MCV: 74 fL — ABNORMAL LOW (ref 78.0–100.0)
MONOS PCT: 6 %
Monocytes Absolute: 0.4 10*3/uL (ref 0.1–1.0)
NEUTROS ABS: 3.6 10*3/uL (ref 1.7–7.7)
NEUTROS PCT: 50 %
Platelets: 298 10*3/uL (ref 150–400)
RBC: 4.42 MIL/uL (ref 3.87–5.11)
RDW: 18.1 % — ABNORMAL HIGH (ref 11.5–15.5)
WBC: 7.2 10*3/uL (ref 4.0–10.5)

## 2017-05-01 LAB — PREGNANCY, URINE: PREG TEST UR: NEGATIVE

## 2017-05-01 LAB — BASIC METABOLIC PANEL
Anion gap: 10 (ref 5–15)
BUN: 11 mg/dL (ref 6–20)
CHLORIDE: 108 mmol/L (ref 101–111)
CO2: 25 mmol/L (ref 22–32)
CREATININE: 0.54 mg/dL (ref 0.44–1.00)
Calcium: 8.9 mg/dL (ref 8.9–10.3)
GFR calc non Af Amer: >60 mL/min (ref >60)
Glucose, Bld: 108 mg/dL — ABNORMAL HIGH (ref 65–99)
POTASSIUM: 3.5 mmol/L (ref 3.5–5.1)
SODIUM: 143 mmol/L (ref 135–145)

## 2017-05-01 MED ORDER — NAPROXEN 500 MG PO TABS: 500.0000 mg | ORAL_TABLET | Freq: Two times a day (BID) | ORAL | 0 refills | Status: DC

## 2017-05-01 MED ORDER — OXYCODONE-ACETAMINOPHEN 5-325 MG PO TABS
1.0000 | ORAL_TABLET | Freq: Once | ORAL | Status: AC
  Administered 2017-05-01: 1 via ORAL
  Filled 2017-05-01: qty 1

## 2017-05-01 NOTE — Discharge Instructions (Addendum)
After fall and seizures.  Her work-up is largely reassuring.  Your scans are negative.  You likely sustained a mild dental injury.  You need to follow-up with dentistry for recheck.  Additionally, it is very important that you follow-up with your primary doctor and/or neurologist regarding reinitiation of antiepileptics given recent increased activity.  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  You may need an outpatient MRI.  Follow-up with your primary physician for this testing.
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
## 2017-05-01 NOTE — ED Notes (Signed)
Patient transported to CT 

## 2017-05-01 NOTE — ED Notes (Signed)
Pt given coke 
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
## 2017-05-23 ENCOUNTER — Encounter (HOSPITAL_COMMUNITY): Payer: Self-pay | Admitting: Emergency Medicine

## 2017-05-23 ENCOUNTER — Emergency Department (HOSPITAL_COMMUNITY)
Admission: EM | Admit: 2017-05-23 | Discharge: 2017-05-23 | Disposition: A | Discharge status: Home / Self Care | Payer: Self-pay | Attending: Physician Assistant | Admitting: Physician Assistant

## 2017-05-23 ENCOUNTER — Emergency Department (HOSPITAL_COMMUNITY): Payer: Self-pay

## 2017-05-23 ENCOUNTER — Other Ambulatory Visit: Payer: Self-pay

## 2017-05-23 DIAGNOSIS — R51 Headache: Secondary | ICD-10-CM | POA: Insufficient documentation

## 2017-05-23 DIAGNOSIS — Z79899 Other long term (current) drug therapy: Secondary | ICD-10-CM | POA: Insufficient documentation

## 2017-05-23 DIAGNOSIS — J0110 Acute frontal sinusitis, unspecified: Secondary | ICD-10-CM | POA: Insufficient documentation

## 2017-05-23 DIAGNOSIS — J069 Acute upper respiratory infection, unspecified: Secondary | ICD-10-CM | POA: Insufficient documentation

## 2017-05-23 MED ORDER — KETOROLAC TROMETHAMINE 30 MG/ML IJ SOLN
30.0000 mg | Freq: Once | INTRAMUSCULAR | Status: AC
  Administered 2017-05-23: 30 mg via INTRAMUSCULAR
  Filled 2017-05-23: qty 1

## 2017-05-23 MED ORDER — ACETAMINOPHEN 325 MG PO TABS
650.0000 mg | ORAL_TABLET | Freq: Once | ORAL | Status: AC
  Administered 2017-05-23: 650 mg via ORAL
  Filled 2017-05-23: qty 2

## 2017-05-23 MED ORDER — AMOXICILLIN-POT CLAVULANATE 875-125 MG PO TABS: 1.0000 | ORAL_TABLET | Freq: Two times a day (BID) | ORAL | 0 refills | Status: DC

## 2017-05-23 MED ORDER — BENZONATATE 100 MG PO CAPS: 200.0000 mg | ORAL_CAPSULE | Freq: Three times a day (TID) | ORAL | 0 refills | Status: DC

## 2017-05-23 MED ORDER — FLUTICASONE PROPIONATE 50 MCG/ACT NA SUSP: 1.0000 | Freq: Every day | NASAL | 2 refills | Status: DC
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
## 2017-05-23 NOTE — ED Triage Notes (Signed)
Pt complaint of productive cough, sore throat, and headache ongoing for a month.

## 2017-06-06 ENCOUNTER — Encounter (HOSPITAL_COMMUNITY): Payer: Self-pay | Admitting: Emergency Medicine

## 2017-06-06 ENCOUNTER — Other Ambulatory Visit: Payer: Self-pay

## 2017-06-06 ENCOUNTER — Emergency Department (HOSPITAL_COMMUNITY): Payer: Self-pay

## 2017-06-06 ENCOUNTER — Emergency Department (HOSPITAL_COMMUNITY)
Admission: EM | Admit: 2017-06-06 | Discharge: 2017-06-06 | Disposition: A | Discharge status: Home / Self Care | Payer: Self-pay | Attending: Physician Assistant | Admitting: Physician Assistant

## 2017-06-06 DIAGNOSIS — J069 Acute upper respiratory infection, unspecified: Secondary | ICD-10-CM | POA: Insufficient documentation

## 2017-06-06 DIAGNOSIS — Z79899 Other long term (current) drug therapy: Secondary | ICD-10-CM | POA: Insufficient documentation

## 2017-06-06 LAB — COMPREHENSIVE METABOLIC PANEL
ALT: 19 U/L (ref 14–54)
AST: 18 U/L (ref 15–41)
Albumin: 3.7 g/dL (ref 3.5–5.0)
Alkaline Phosphatase: 71 U/L (ref 38–126)
Anion gap: 5 (ref 5–15)
BILIRUBIN TOTAL: 0.5 mg/dL (ref 0.3–1.2)
BUN: 9 mg/dL (ref 6–20)
CHLORIDE: 102 mmol/L (ref 101–111)
CO2: 31 mmol/L (ref 22–32)
CREATININE: 0.6 mg/dL (ref 0.44–1.00)
Calcium: 8.8 mg/dL — ABNORMAL LOW (ref 8.9–10.3)
GFR calc Af Amer: >60 mL/min (ref >60)
Glucose, Bld: 112 mg/dL — ABNORMAL HIGH (ref 65–99)
POTASSIUM: 3.5 mmol/L (ref 3.5–5.1)
Sodium: 138 mmol/L (ref 135–145)
TOTAL PROTEIN: 7.6 g/dL (ref 6.5–8.1)

## 2017-06-06 LAB — CBC
HEMATOCRIT: 33.9 % — AB (ref 36.0–46.0)
Hemoglobin: 10 g/dL — ABNORMAL LOW (ref 12.0–15.0)
MCH: 22.5 pg — AB (ref 26.0–34.0)
MCHC: 29.5 g/dL — ABNORMAL LOW (ref 30.0–36.0)
MCV: 76.4 fL — AB (ref 78.0–100.0)
PLATELETS: 272 10*3/uL (ref 150–400)
RBC: 4.44 MIL/uL (ref 3.87–5.11)
RDW: 18.7 % — AB (ref 11.5–15.5)
WBC: 6.4 10*3/uL (ref 4.0–10.5)

## 2017-06-06 LAB — PREGNANCY, URINE: PREG TEST UR: NEGATIVE

## 2017-06-06 MED ORDER — HYDROCODONE-HOMATROPINE 5-1.5 MG/5ML PO SYRP: 5.0000 mL | ORAL_SOLUTION | Freq: Four times a day (QID) | ORAL | 0 refills | Status: DC | PRN

## 2017-06-06 MED ORDER — DEXAMETHASONE SODIUM PHOSPHATE 10 MG/ML IJ SOLN
10.0000 mg | Freq: Once | INTRAMUSCULAR | Status: AC
  Administered 2017-06-06: 10 mg via INTRAMUSCULAR

## 2017-06-06 MED ORDER — ALBUTEROL SULFATE HFA 108 (90 BASE) MCG/ACT IN AERS
2.0000 | INHALATION_SPRAY | Freq: Once | RESPIRATORY_TRACT | Status: AC
  Administered 2017-06-06: 2 via RESPIRATORY_TRACT
  Filled 2017-06-06: qty 6.7

## 2017-06-06 MED ORDER — DEXAMETHASONE SODIUM PHOSPHATE 10 MG/ML IJ SOLN
10.0000 mg | Freq: Once | INTRAMUSCULAR | Status: DC
  Filled 2017-06-06: qty 1

## 2017-06-06 MED ORDER — ACETAMINOPHEN 325 MG PO TABS
650.0000 mg | ORAL_TABLET | Freq: Once | ORAL | Status: AC
  Administered 2017-06-06: 650 mg via ORAL
  Filled 2017-06-06: qty 2

## 2017-06-06 MED ORDER — AEROCHAMBER PLUS FLO-VU MEDIUM MISC
1.0000 | Freq: Once | Status: AC
  Administered 2017-06-06: 1
  Filled 2017-06-06: qty 1

## 2017-06-06 MED ORDER — BENZONATATE 100 MG PO CAPS: 100.0000 mg | ORAL_CAPSULE | Freq: Three times a day (TID) | ORAL | 0 refills | Status: DC | PRN
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
## 2017-06-06 NOTE — ED Notes (Addendum)
Pt is alert and oriented x 4 and is verbally responsive. Pt is c/o generalized aches,nasal congestion, and cough, Pt reports that she has been taking OTC medications at home and has not been effective.

## 2017-06-06 NOTE — Discharge Instructions (Addendum)
It was a pleasure to take care of you Alicia Becker. You were taken care of for a upper respiratory infection which should resolve in the next few weeks. Please use tessalon pearls and cough syrup for relief and follow up with your primary care physician. Thanks!

## 2017-06-06 NOTE — ED Triage Notes (Signed)
Patient here from home with complaints of productive cough, fever, generalized body aches. Tylenol Motrin without relief. Seen for same last month.

## 2017-06-09 ENCOUNTER — Emergency Department
Admission: EM | Admit: 2017-06-09 | Discharge: 2017-06-09 | Disposition: A | Discharge status: Home / Self Care | Payer: Self-pay | Attending: Emergency Medicine | Admitting: Emergency Medicine

## 2017-06-09 ENCOUNTER — Encounter: Payer: Self-pay | Admitting: Emergency Medicine

## 2017-06-09 ENCOUNTER — Other Ambulatory Visit: Payer: Self-pay

## 2017-06-09 DIAGNOSIS — J9801 Acute bronchospasm: Secondary | ICD-10-CM | POA: Insufficient documentation

## 2017-06-09 MED ORDER — HYDROCOD POLST-CPM POLST ER 10-8 MG/5ML PO SUER
5.0000 mL | Freq: Once | ORAL | Status: AC
  Administered 2017-06-09: 5 mL via ORAL
  Filled 2017-06-09: qty 5

## 2017-06-09 MED ORDER — IPRATROPIUM-ALBUTEROL 0.5-2.5 (3) MG/3ML IN SOLN
3.0000 mL | Freq: Once | RESPIRATORY_TRACT | Status: AC
  Administered 2017-06-09: 3 mL via RESPIRATORY_TRACT
  Filled 2017-06-09: qty 3

## 2017-06-09 MED ORDER — METHYLPREDNISOLONE 4 MG PO TBPK: ORAL_TABLET | ORAL | 0 refills | Status: DC

## 2017-06-09 MED ORDER — BENZONATATE 100 MG PO CAPS
200.0000 mg | ORAL_CAPSULE | Freq: Once | ORAL | Status: AC
  Administered 2017-06-09: 200 mg via ORAL
  Filled 2017-06-09: qty 2

## 2017-06-09 MED ORDER — HYDROCOD POLST-CPM POLST ER 10-8 MG/5ML PO SUER: 5.0000 mL | Freq: Two times a day (BID) | ORAL | 0 refills | Status: DC

## 2017-06-09 MED ORDER — KETOROLAC TROMETHAMINE 60 MG/2ML IM SOLN
60.0000 mg | Freq: Once | INTRAMUSCULAR | Status: AC
  Administered 2017-06-09: 60 mg via INTRAMUSCULAR
  Filled 2017-06-09: qty 2
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
## 2017-06-09 NOTE — ED Triage Notes (Signed)
Cough x 2 weeks. Seen and RX amoxicillin which she has taken. Continues cough. Speaking in full sentences with no difficulty. Had chest xray x days ago at [facility]. Cough productive clear.

## 2017-06-09 NOTE — Discharge Instructions (Addendum)
Advised to increase your Tessalon Perles from 100 mg 3 times a day to 200 mg 3 times a day

## 2017-06-09 NOTE — ED Triage Notes (Signed)
First Nurse Note:  Arrives with c/o several week history of URI.  STates has been "On and off antibiotics" and receiving treatment through [facility] ED.  Presents to ED today for c/o not feeling better.    Patient is AAOx3.  Skin warm and dry. NAD.

## 2017-08-07 ENCOUNTER — Other Ambulatory Visit: Payer: Self-pay

## 2017-08-07 ENCOUNTER — Encounter (HOSPITAL_COMMUNITY): Payer: Self-pay

## 2017-08-07 ENCOUNTER — Emergency Department (HOSPITAL_COMMUNITY)
Admission: EM | Admit: 2017-08-07 | Discharge: 2017-08-07 | Disposition: A | Discharge status: Home / Self Care | Payer: Self-pay | Attending: Emergency Medicine | Admitting: Emergency Medicine

## 2017-08-07 DIAGNOSIS — R569 Unspecified convulsions: Secondary | ICD-10-CM

## 2017-08-07 DIAGNOSIS — D649 Anemia, unspecified: Secondary | ICD-10-CM | POA: Insufficient documentation

## 2017-08-07 DIAGNOSIS — Z79899 Other long term (current) drug therapy: Secondary | ICD-10-CM | POA: Insufficient documentation

## 2017-08-07 LAB — CBC WITH DIFFERENTIAL/PLATELET
Basophils Absolute: 0 10*3/uL (ref 0.0–0.1)
Basophils Relative: 0 %
Eosinophils Absolute: 0.2 10*3/uL (ref 0.0–0.7)
Eosinophils Relative: 2 %
HCT: 34.9 % — ABNORMAL LOW (ref 36.0–46.0)
HEMOGLOBIN: 10.6 g/dL — AB (ref 12.0–15.0)
LYMPHS ABS: 3.1 10*3/uL (ref 0.7–4.0)
Lymphocytes Relative: 43 %
MCH: 23.2 pg — AB (ref 26.0–34.0)
MCHC: 30.4 g/dL (ref 30.0–36.0)
MCV: 76.5 fL — ABNORMAL LOW (ref 78.0–100.0)
MONO ABS: 0.5 10*3/uL (ref 0.1–1.0)
MONOS PCT: 7 %
NEUTROS PCT: 48 %
Neutro Abs: 3.5 10*3/uL (ref 1.7–7.7)
Platelets: 396 10*3/uL (ref 150–400)
RBC: 4.56 MIL/uL (ref 3.87–5.11)
RDW: 19.7 % — AB (ref 11.5–15.5)
WBC: 7.3 10*3/uL (ref 4.0–10.5)

## 2017-08-07 LAB — I-STAT BETA HCG BLOOD, ED (MC, WL, AP ONLY): I-stat hCG, quantitative: <5 m[IU]/mL (ref <5)

## 2017-08-07 LAB — CBG MONITORING, ED: GLUCOSE-CAPILLARY: 92 mg/dL (ref 70–99)

## 2017-08-07 LAB — BASIC METABOLIC PANEL
Anion gap: 8 (ref 5–15)
BUN: 12 mg/dL (ref 6–20)
CALCIUM: 9 mg/dL (ref 8.9–10.3)
CHLORIDE: 103 mmol/L (ref 98–111)
CO2: 28 mmol/L (ref 22–32)
CREATININE: 0.53 mg/dL (ref 0.44–1.00)
GFR calc Af Amer: >60 mL/min (ref >60)
GFR calc non Af Amer: >60 mL/min (ref >60)
Glucose, Bld: 93 mg/dL (ref 70–99)
Potassium: 4 mmol/L (ref 3.5–5.1)
SODIUM: 139 mmol/L (ref 135–145)

## 2017-08-07 MED ORDER — ONDANSETRON HCL 4 MG/2ML IJ SOLN
4.0000 mg | Freq: Once | INTRAMUSCULAR | Status: AC
  Administered 2017-08-07: 4 mg via INTRAVENOUS
  Filled 2017-08-07: qty 2

## 2017-08-07 MED ORDER — FENTANYL CITRATE (PF) 100 MCG/2ML IJ SOLN
50.0000 ug | Freq: Once | INTRAMUSCULAR | Status: AC
  Administered 2017-08-07: 50 ug via INTRAVENOUS
  Filled 2017-08-07: qty 2

## 2017-08-07 MED ORDER — SODIUM CHLORIDE 0.9 % IV BOLUS
1000.0000 mL | Freq: Once | INTRAVENOUS | Status: AC
  Administered 2017-08-07: 1000 mL via INTRAVENOUS

## 2017-08-07 NOTE — Discharge Instructions (Signed)
You are slightly anemic; however, the rest of your labs were good.  Recommend follow-up with neurologist for further recommendations about your seizures.

## 2017-08-07 NOTE — ED Triage Notes (Signed)
Patient states she has facial pain and thinks she fell on her face when she had a seizure.

## 2017-08-07 NOTE — ED Notes (Signed)
Made Dr Lacinda Axon aware that patient is ready for discharge

## 2017-08-07 NOTE — ED Triage Notes (Signed)
Per EMS- Patient's roommate reported that the patient was crawling on the floor and had shaking x 1-2 minutes. Patient states she has not been taking seizure meds because she does not like the way they make her feel. Patient also c/o bilateral knee swelling and has been taking her mother's Lasix for the swelling.
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
## 2017-09-12 ENCOUNTER — Emergency Department (HOSPITAL_COMMUNITY): Payer: Self-pay

## 2017-09-12 ENCOUNTER — Other Ambulatory Visit: Payer: Self-pay

## 2017-09-12 ENCOUNTER — Encounter (HOSPITAL_COMMUNITY): Payer: Self-pay | Admitting: Emergency Medicine

## 2017-09-12 ENCOUNTER — Emergency Department (HOSPITAL_COMMUNITY)
Admission: EM | Admit: 2017-09-12 | Discharge: 2017-09-12 | Disposition: A | Discharge status: Home / Self Care | Payer: Self-pay | Attending: Emergency Medicine | Admitting: Emergency Medicine

## 2017-09-12 DIAGNOSIS — J069 Acute upper respiratory infection, unspecified: Secondary | ICD-10-CM

## 2017-09-12 DIAGNOSIS — R69 Illness, unspecified: Secondary | ICD-10-CM

## 2017-09-12 DIAGNOSIS — B9789 Other viral agents as the cause of diseases classified elsewhere: Secondary | ICD-10-CM

## 2017-09-12 DIAGNOSIS — Z79899 Other long term (current) drug therapy: Secondary | ICD-10-CM | POA: Insufficient documentation

## 2017-09-12 DIAGNOSIS — J111 Influenza due to unidentified influenza virus with other respiratory manifestations: Secondary | ICD-10-CM | POA: Insufficient documentation

## 2017-09-12 LAB — URINALYSIS, ROUTINE W REFLEX MICROSCOPIC
BILIRUBIN URINE: NEGATIVE
Glucose, UA: NEGATIVE mg/dL
HGB URINE DIPSTICK: NEGATIVE
KETONES UR: NEGATIVE mg/dL
LEUKOCYTES UA: NEGATIVE
Nitrite: NEGATIVE
PH: 7 (ref 5.0–8.0)
PROTEIN: NEGATIVE mg/dL
Specific Gravity, Urine: 1.017 (ref 1.005–1.030)

## 2017-09-12 LAB — CBC WITH DIFFERENTIAL/PLATELET
Abs Immature Granulocytes: 0 10*3/uL (ref 0.0–0.1)
BASOS ABS: 0 10*3/uL (ref 0.0–0.1)
BASOS PCT: 1 %
EOS PCT: 10 %
Eosinophils Absolute: 0.6 10*3/uL (ref 0.0–0.7)
HCT: 36.2 % (ref 36.0–46.0)
Hemoglobin: 10.6 g/dL — ABNORMAL LOW (ref 12.0–15.0)
Immature Granulocytes: 0 %
Lymphocytes Relative: 32 %
Lymphs Abs: 2.1 10*3/uL (ref 0.7–4.0)
MCH: 23.2 pg — AB (ref 26.0–34.0)
MCHC: 29.3 g/dL — AB (ref 30.0–36.0)
MCV: 79.2 fL (ref 78.0–100.0)
Monocytes Absolute: 0.6 10*3/uL (ref 0.1–1.0)
Monocytes Relative: 9 %
NEUTROS ABS: 3.1 10*3/uL (ref 1.7–7.7)
Neutrophils Relative %: 48 %
PLATELETS: 293 10*3/uL (ref 150–400)
RBC: 4.57 MIL/uL (ref 3.87–5.11)
RDW: 18.9 % — ABNORMAL HIGH (ref 11.5–15.5)
WBC: 6.4 10*3/uL (ref 4.0–10.5)

## 2017-09-12 LAB — COMPREHENSIVE METABOLIC PANEL
ALK PHOS: 68 U/L (ref 38–126)
ALT: 19 U/L (ref 0–44)
ANION GAP: 12 (ref 5–15)
AST: 17 U/L (ref 15–41)
Albumin: 3.4 g/dL — ABNORMAL LOW (ref 3.5–5.0)
BILIRUBIN TOTAL: 0.3 mg/dL (ref 0.3–1.2)
BUN: 7 mg/dL (ref 6–20)
CALCIUM: 9.1 mg/dL (ref 8.9–10.3)
CO2: 25 mmol/L (ref 22–32)
Chloride: 100 mmol/L (ref 98–111)
Creatinine, Ser: 0.51 mg/dL (ref 0.44–1.00)
GFR calc Af Amer: >60 mL/min (ref >60)
Glucose, Bld: 97 mg/dL (ref 70–99)
POTASSIUM: 4 mmol/L (ref 3.5–5.1)
Sodium: 137 mmol/L (ref 135–145)
TOTAL PROTEIN: 6.6 g/dL (ref 6.5–8.1)

## 2017-09-12 LAB — LIPASE, BLOOD: LIPASE: 31 U/L (ref 11–51)

## 2017-09-12 LAB — POC URINE PREG, ED: PREG TEST UR: NEGATIVE

## 2017-09-12 MED ORDER — ALBUTEROL SULFATE (2.5 MG/3ML) 0.083% IN NEBU
5.0000 mg | INHALATION_SOLUTION | Freq: Once | RESPIRATORY_TRACT | Status: AC
  Administered 2017-09-12: 5 mg via RESPIRATORY_TRACT
  Filled 2017-09-12: qty 6

## 2017-09-12 MED ORDER — IBUPROFEN 800 MG PO TABS: 800.0000 mg | ORAL_TABLET | Freq: Three times a day (TID) | ORAL | 0 refills | Status: DC | PRN

## 2017-09-12 MED ORDER — SODIUM CHLORIDE 0.9 % IV BOLUS
1000.0000 mL | Freq: Once | INTRAVENOUS | Status: AC
  Administered 2017-09-12: 1000 mL via INTRAVENOUS

## 2017-09-12 MED ORDER — OXYCODONE-ACETAMINOPHEN 5-325 MG PO TABS
1.0000 | ORAL_TABLET | Freq: Once | ORAL | Status: AC
  Administered 2017-09-12: 1 via ORAL
  Filled 2017-09-12: qty 1

## 2017-09-12 MED ORDER — PREDNISONE 50 MG PO TABS: 50.0000 mg | ORAL_TABLET | Freq: Every day | ORAL | 0 refills | Status: DC

## 2017-09-12 MED ORDER — GUAIFENESIN ER 1200 MG PO TB12: 1.0000 | ORAL_TABLET | Freq: Two times a day (BID) | ORAL | 0 refills | Status: DC

## 2017-09-12 MED ORDER — PROMETHAZINE-DM 6.25-15 MG/5ML PO SYRP: 10.0000 mL | ORAL_SOLUTION | Freq: Four times a day (QID) | ORAL | 0 refills | Status: DC | PRN

## 2017-09-12 MED ORDER — KETOROLAC TROMETHAMINE 30 MG/ML IJ SOLN
30.0000 mg | Freq: Once | INTRAMUSCULAR | Status: AC
  Administered 2017-09-12: 30 mg via INTRAVENOUS
  Filled 2017-09-12: qty 1

## 2017-09-12 NOTE — ED Triage Notes (Addendum)
PT states has had body aches, vomiting, diarr for 3 weeks. She thought was stomach bug. Has treated with otc medicine. States having trouble breathing. Thought allergies bc has hx but not improving. She states she has been trying to drink liquids. Fever yesterday morning. URI symptoms sore throat, coughing, congestion, SOB. Denies chest pain.
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
## 2017-09-12 NOTE — ED Notes (Signed)
Pt given ginger ale.

## 2017-09-12 NOTE — Discharge Instructions (Addendum)
Return here as needed. Follow up with your doctor. Increase your fluid intake. °

## 2017-09-12 NOTE — ED Notes (Signed)
Discharge instructions and prescriptions discussed with Pt. Pt verbalized understanding. Pt stable and ambulatory.   

## 2017-10-03 ENCOUNTER — Emergency Department (HOSPITAL_COMMUNITY): Admission: EM | Admit: 2017-10-03 | Discharge: 2017-10-03 | Discharge status: Against Medical Advice | Payer: Self-pay

## 2017-10-03 ENCOUNTER — Emergency Department (HOSPITAL_BASED_OUTPATIENT_CLINIC_OR_DEPARTMENT_OTHER)
Admission: EM | Admit: 2017-10-03 | Discharge: 2017-10-03 | Disposition: A | Discharge status: Home / Self Care | Payer: Self-pay | Attending: Emergency Medicine | Admitting: Emergency Medicine

## 2017-10-03 ENCOUNTER — Emergency Department (HOSPITAL_BASED_OUTPATIENT_CLINIC_OR_DEPARTMENT_OTHER): Payer: Self-pay

## 2017-10-03 ENCOUNTER — Other Ambulatory Visit: Payer: Self-pay

## 2017-10-03 ENCOUNTER — Encounter (HOSPITAL_BASED_OUTPATIENT_CLINIC_OR_DEPARTMENT_OTHER): Payer: Self-pay | Admitting: *Deleted

## 2017-10-03 DIAGNOSIS — N939 Abnormal uterine and vaginal bleeding, unspecified: Secondary | ICD-10-CM | POA: Insufficient documentation

## 2017-10-03 DIAGNOSIS — N760 Acute vaginitis: Secondary | ICD-10-CM | POA: Insufficient documentation

## 2017-10-03 DIAGNOSIS — B9689 Other specified bacterial agents as the cause of diseases classified elsewhere: Secondary | ICD-10-CM | POA: Insufficient documentation

## 2017-10-03 DIAGNOSIS — D219 Benign neoplasm of connective and other soft tissue, unspecified: Secondary | ICD-10-CM | POA: Insufficient documentation

## 2017-10-03 DIAGNOSIS — A599 Trichomoniasis, unspecified: Secondary | ICD-10-CM | POA: Insufficient documentation

## 2017-10-03 DIAGNOSIS — Z79899 Other long term (current) drug therapy: Secondary | ICD-10-CM | POA: Insufficient documentation

## 2017-10-03 LAB — CBC WITH DIFFERENTIAL/PLATELET
BASOS PCT: 1 %
Basophils Absolute: 0 10*3/uL (ref 0.0–0.1)
EOS ABS: 0.3 10*3/uL (ref 0.0–0.7)
Eosinophils Relative: 5 %
HEMATOCRIT: 33 % — AB (ref 36.0–46.0)
HEMOGLOBIN: 10.3 g/dL — AB (ref 12.0–15.0)
LYMPHS ABS: 3 10*3/uL (ref 0.7–4.0)
Lymphocytes Relative: 44 %
MCH: 23.7 pg — ABNORMAL LOW (ref 26.0–34.0)
MCHC: 31.2 g/dL (ref 30.0–36.0)
MCV: 75.9 fL — ABNORMAL LOW (ref 78.0–100.0)
Monocytes Absolute: 0.5 10*3/uL (ref 0.1–1.0)
Monocytes Relative: 7 %
NEUTROS ABS: 2.9 10*3/uL (ref 1.7–7.7)
NEUTROS PCT: 43 %
Platelets: 297 10*3/uL (ref 150–400)
RBC: 4.35 MIL/uL (ref 3.87–5.11)
RDW: 18.5 % — ABNORMAL HIGH (ref 11.5–15.5)
WBC: 6.7 10*3/uL (ref 4.0–10.5)

## 2017-10-03 LAB — WET PREP, GENITAL
Sperm: NONE SEEN
Yeast Wet Prep HPF POC: NONE SEEN

## 2017-10-03 LAB — HCG, QUANTITATIVE, PREGNANCY

## 2017-10-03 LAB — COMPREHENSIVE METABOLIC PANEL
ALBUMIN: 3.7 g/dL (ref 3.5–5.0)
ALK PHOS: 67 U/L (ref 38–126)
ALT: 17 U/L (ref 0–44)
AST: 16 U/L (ref 15–41)
Anion gap: 8 (ref 5–15)
BUN: 12 mg/dL (ref 6–20)
CALCIUM: 8.8 mg/dL — AB (ref 8.9–10.3)
CO2: 27 mmol/L (ref 22–32)
CREATININE: 0.68 mg/dL (ref 0.44–1.00)
Chloride: 104 mmol/L (ref 98–111)
GFR calc Af Amer: >60 mL/min (ref >60)
GFR calc non Af Amer: >60 mL/min (ref >60)
GLUCOSE: 106 mg/dL — AB (ref 70–99)
Potassium: 3.4 mmol/L — ABNORMAL LOW (ref 3.5–5.1)
SODIUM: 139 mmol/L (ref 135–145)
Total Bilirubin: 0.3 mg/dL (ref 0.3–1.2)
Total Protein: 7.1 g/dL (ref 6.5–8.1)

## 2017-10-03 LAB — URINALYSIS, ROUTINE W REFLEX MICROSCOPIC
BILIRUBIN URINE: NEGATIVE
GLUCOSE, UA: NEGATIVE mg/dL
KETONES UR: NEGATIVE mg/dL
Leukocytes, UA: NEGATIVE
Nitrite: NEGATIVE
Protein, ur: NEGATIVE mg/dL
Specific Gravity, Urine: >1.03 — ABNORMAL HIGH (ref 1.005–1.030)
pH: 5.5 (ref 5.0–8.0)

## 2017-10-03 LAB — URINALYSIS, MICROSCOPIC (REFLEX): WBC, UA: NONE SEEN WBC/hpf (ref 0–5)

## 2017-10-03 MED ORDER — HYDROMORPHONE HCL 1 MG/ML IJ SOLN
1.0000 mg | Freq: Once | INTRAMUSCULAR | Status: AC
  Administered 2017-10-03: 1 mg via INTRAVENOUS
  Filled 2017-10-03: qty 1

## 2017-10-03 MED ORDER — CEFTRIAXONE SODIUM 250 MG IJ SOLR
250.0000 mg | Freq: Once | INTRAMUSCULAR | Status: AC
  Administered 2017-10-03: 250 mg via INTRAMUSCULAR
  Filled 2017-10-03: qty 250

## 2017-10-03 MED ORDER — AZITHROMYCIN 250 MG PO TABS
1000.0000 mg | ORAL_TABLET | Freq: Once | ORAL | Status: AC
  Administered 2017-10-03: 1000 mg via ORAL
  Filled 2017-10-03: qty 4

## 2017-10-03 MED ORDER — METRONIDAZOLE 500 MG PO TABS: 500.0000 mg | ORAL_TABLET | Freq: Two times a day (BID) | ORAL | 0 refills | Status: AC

## 2017-10-03 MED ORDER — KETOROLAC TROMETHAMINE 15 MG/ML IJ SOLN
15.0000 mg | Freq: Once | INTRAMUSCULAR | Status: AC
  Administered 2017-10-03: 15 mg via INTRAMUSCULAR
  Filled 2017-10-03: qty 1

## 2017-10-03 MED ORDER — ONDANSETRON HCL 4 MG/2ML IJ SOLN
4.0000 mg | Freq: Once | INTRAMUSCULAR | Status: AC
  Administered 2017-10-03: 4 mg via INTRAVENOUS
  Filled 2017-10-03: qty 2

## 2017-10-03 NOTE — ED Notes (Addendum)
Pt notified staff that she was leaving, to go to urgent care around 15:45, because she was in too much pain. Staff encouraged the patient to stay and be seen. Pt came up to staff again stating that she could no longer wait because of her pain and that she was going somewhere else.

## 2017-10-03 NOTE — ED Notes (Signed)
Patient educated about not driving or performing other critical tasks (such as operating heavy machinery, caring for infant/toddler/child) due to sedative nature of narcotic medications received while in the ED.  Pt/caregiver verbalized understanding.   

## 2017-10-03 NOTE — ED Triage Notes (Signed)
Pt c/o heavy vaginal bleeding x 1 month
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
## 2017-10-03 NOTE — Discharge Instructions (Addendum)
I have prescribed antibiotics to treat your infection please take 1 tablet twice a day for the next 7 days.  Today you were treated for gonorrhea and chlamydia please refrain from sexual activity for the next 10 days.  If your symptoms worsen or you experience any fever, severe abdominal pain please return to the ED for reevaluation.  You may follow-up with your GYN as needed.

## 2017-10-04 LAB — GC/CHLAMYDIA PROBE AMP (~~LOC~~) NOT AT ARMC
CHLAMYDIA, DNA PROBE: NEGATIVE
NEISSERIA GONORRHEA: NEGATIVE

## 2017-12-02 ENCOUNTER — Emergency Department (HOSPITAL_COMMUNITY): Payer: Self-pay

## 2017-12-02 ENCOUNTER — Other Ambulatory Visit: Payer: Self-pay

## 2017-12-02 ENCOUNTER — Emergency Department (HOSPITAL_COMMUNITY)
Admission: EM | Admit: 2017-12-02 | Discharge: 2017-12-02 | Disposition: A | Discharge status: Home / Self Care | Payer: Self-pay | Attending: Emergency Medicine | Admitting: Emergency Medicine

## 2017-12-02 ENCOUNTER — Encounter (HOSPITAL_COMMUNITY): Payer: Self-pay | Admitting: Emergency Medicine

## 2017-12-02 DIAGNOSIS — R1011 Right upper quadrant pain: Secondary | ICD-10-CM | POA: Insufficient documentation

## 2017-12-02 DIAGNOSIS — Z79899 Other long term (current) drug therapy: Secondary | ICD-10-CM | POA: Insufficient documentation

## 2017-12-02 DIAGNOSIS — R1110 Vomiting, unspecified: Secondary | ICD-10-CM | POA: Insufficient documentation

## 2017-12-02 DIAGNOSIS — R1013 Epigastric pain: Secondary | ICD-10-CM | POA: Insufficient documentation

## 2017-12-02 DIAGNOSIS — R030 Elevated blood-pressure reading, without diagnosis of hypertension: Secondary | ICD-10-CM | POA: Insufficient documentation

## 2017-12-02 LAB — CBC WITH DIFFERENTIAL/PLATELET
Abs Immature Granulocytes: 0.03 10*3/uL (ref 0.00–0.07)
BASOS ABS: 0 10*3/uL (ref 0.0–0.1)
Basophils Relative: 0 %
Eosinophils Absolute: 0.2 10*3/uL (ref 0.0–0.5)
Eosinophils Relative: 3 %
HEMATOCRIT: 40.7 % (ref 36.0–46.0)
HEMOGLOBIN: 11.4 g/dL — AB (ref 12.0–15.0)
IMMATURE GRANULOCYTES: 0 %
LYMPHS ABS: 3.6 10*3/uL (ref 0.7–4.0)
LYMPHS PCT: 39 %
MCH: 22.6 pg — ABNORMAL LOW (ref 26.0–34.0)
MCHC: 28 g/dL — ABNORMAL LOW (ref 30.0–36.0)
MCV: 80.8 fL (ref 80.0–100.0)
Monocytes Absolute: 0.6 10*3/uL (ref 0.1–1.0)
Monocytes Relative: 6 %
NEUTROS ABS: 4.7 10*3/uL (ref 1.7–7.7)
NEUTROS PCT: 52 %
NRBC: 0 % (ref 0.0–0.2)
Platelets: 400 10*3/uL (ref 150–400)
RBC: 5.04 MIL/uL (ref 3.87–5.11)
RDW: 17.7 % — ABNORMAL HIGH (ref 11.5–15.5)
WBC: 9.2 10*3/uL (ref 4.0–10.5)

## 2017-12-02 LAB — URINALYSIS, ROUTINE W REFLEX MICROSCOPIC
Bacteria, UA: NONE SEEN
Bilirubin Urine: NEGATIVE
GLUCOSE, UA: NEGATIVE mg/dL
Ketones, ur: NEGATIVE mg/dL
Leukocytes, UA: NEGATIVE
Nitrite: NEGATIVE
PROTEIN: NEGATIVE mg/dL
SPECIFIC GRAVITY, URINE: 1.019 (ref 1.005–1.030)
pH: 5 (ref 5.0–8.0)

## 2017-12-02 LAB — COMPREHENSIVE METABOLIC PANEL
ALBUMIN: 3.7 g/dL (ref 3.5–5.0)
ALK PHOS: 72 U/L (ref 38–126)
ALT: 22 U/L (ref 0–44)
ANION GAP: 8 (ref 5–15)
AST: 19 U/L (ref 15–41)
BUN: 12 mg/dL (ref 6–20)
CALCIUM: 9.6 mg/dL (ref 8.9–10.3)
CHLORIDE: 101 mmol/L (ref 98–111)
CO2: 26 mmol/L (ref 22–32)
Creatinine, Ser: 0.6 mg/dL (ref 0.44–1.00)
GFR calc Af Amer: >60 mL/min (ref >60)
GFR calc non Af Amer: >60 mL/min (ref >60)
GLUCOSE: 105 mg/dL — AB (ref 70–99)
POTASSIUM: 4.2 mmol/L (ref 3.5–5.1)
SODIUM: 135 mmol/L (ref 135–145)
Total Bilirubin: 0.4 mg/dL (ref 0.3–1.2)
Total Protein: 7.5 g/dL (ref 6.5–8.1)

## 2017-12-02 LAB — ACETAMINOPHEN LEVEL

## 2017-12-02 LAB — LIPASE, BLOOD: Lipase: 63 U/L — ABNORMAL HIGH (ref 11–51)

## 2017-12-02 LAB — I-STAT TROPONIN, ED: TROPONIN I, POC: 0 ng/mL (ref 0.00–0.08)

## 2017-12-02 LAB — I-STAT BETA HCG BLOOD, ED (MC, WL, AP ONLY)

## 2017-12-02 MED ORDER — METOCLOPRAMIDE HCL 5 MG/ML IJ SOLN
5.0000 mg | Freq: Once | INTRAMUSCULAR | Status: AC
  Administered 2017-12-02: 5 mg via INTRAVENOUS
  Filled 2017-12-02: qty 2

## 2017-12-02 MED ORDER — METOCLOPRAMIDE HCL 10 MG PO TABS: 10.0000 mg | ORAL_TABLET | Freq: Four times a day (QID) | ORAL | 0 refills | Status: DC | PRN

## 2017-12-02 MED ORDER — ONDANSETRON HCL 4 MG/2ML IJ SOLN
4.0000 mg | Freq: Once | INTRAMUSCULAR | Status: AC
  Administered 2017-12-02: 4 mg via INTRAVENOUS
  Filled 2017-12-02: qty 2

## 2017-12-02 MED ORDER — MORPHINE SULFATE (PF) 4 MG/ML IV SOLN
4.0000 mg | Freq: Once | INTRAVENOUS | Status: AC
  Administered 2017-12-02: 4 mg via INTRAVENOUS
  Filled 2017-12-02: qty 1

## 2017-12-02 MED ORDER — FAMOTIDINE 20 MG PO TABS
20.0000 mg | ORAL_TABLET | Freq: Once | ORAL | Status: AC
  Administered 2017-12-02: 20 mg via ORAL
  Filled 2017-12-02: qty 1

## 2017-12-02 MED ORDER — FAMOTIDINE 20 MG PO TABS: 20.0000 mg | ORAL_TABLET | Freq: Two times a day (BID) | ORAL | 0 refills | Status: DC

## 2017-12-02 MED ORDER — ALUM & MAG HYDROXIDE-SIMETH 200-200-20 MG/5ML PO SUSP
30.0000 mL | Freq: Once | ORAL | Status: AC
  Administered 2017-12-02: 30 mL via ORAL
  Filled 2017-12-02: qty 30

## 2017-12-02 NOTE — ED Triage Notes (Signed)
C/o abd pain with nausea for approx 1 week- last BM yesterday, last vomiting Friday-- pain with urination-- "feels like the pain is sitting on  My bladder"  Pt states that she does still have an appetite, "but it hurts to eat" has been drinking orange juice, and taking ibuprofen -

## 2017-12-02 NOTE — Discharge Instructions (Addendum)
Take Maalox 2 tablespoons after meals and at bedtime.  Take the medication prescribed as needed for nausea.  Also take the other medicine(famotidine or Pepcid) to help your stomach.  Stay on clear liquids such as juice, Jell-O, clear broth for the next 24 hours.  Avoid drugs such as ibuprofen(Motrin), the class of drugs known as NSAIDs which can irritate your stomach.  If you continue to have pain in the next week call Dr. Ernie Hew to schedule an office appointment you may need referral to a gastroenterologist.  Your blood pressure should be rechecked with Dr. Ival Bible office within the next  week.  Today's was elevated at 157/105
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
## 2017-12-09 ENCOUNTER — Emergency Department (HOSPITAL_COMMUNITY)
Admission: EM | Admit: 2017-12-09 | Discharge: 2017-12-09 | Disposition: A | Discharge status: Home / Self Care | Payer: Self-pay | Attending: Emergency Medicine | Admitting: Emergency Medicine

## 2017-12-09 ENCOUNTER — Encounter (HOSPITAL_COMMUNITY): Payer: Self-pay | Admitting: Emergency Medicine

## 2017-12-09 DIAGNOSIS — F17200 Nicotine dependence, unspecified, uncomplicated: Secondary | ICD-10-CM | POA: Insufficient documentation

## 2017-12-09 DIAGNOSIS — R6883 Chills (without fever): Secondary | ICD-10-CM | POA: Insufficient documentation

## 2017-12-09 DIAGNOSIS — Z79899 Other long term (current) drug therapy: Secondary | ICD-10-CM | POA: Insufficient documentation

## 2017-12-09 DIAGNOSIS — R112 Nausea with vomiting, unspecified: Secondary | ICD-10-CM | POA: Insufficient documentation

## 2017-12-09 DIAGNOSIS — R1013 Epigastric pain: Secondary | ICD-10-CM | POA: Insufficient documentation

## 2017-12-09 LAB — CBC
HEMATOCRIT: 35.5 % — AB (ref 36.0–46.0)
Hemoglobin: 10.3 g/dL — ABNORMAL LOW (ref 12.0–15.0)
MCH: 23.6 pg — AB (ref 26.0–34.0)
MCHC: 29 g/dL — AB (ref 30.0–36.0)
MCV: 81.4 fL (ref 80.0–100.0)
NRBC: 0 % (ref 0.0–0.2)
PLATELETS: 344 10*3/uL (ref 150–400)
RBC: 4.36 MIL/uL (ref 3.87–5.11)
RDW: 17.8 % — AB (ref 11.5–15.5)
WBC: 8 10*3/uL (ref 4.0–10.5)

## 2017-12-09 LAB — COMPREHENSIVE METABOLIC PANEL
ALBUMIN: 3.9 g/dL (ref 3.5–5.0)
ALT: 21 U/L (ref 0–44)
ANION GAP: 6 (ref 5–15)
AST: 18 U/L (ref 15–41)
Alkaline Phosphatase: 64 U/L (ref 38–126)
BUN: 13 mg/dL (ref 6–20)
CALCIUM: 9.1 mg/dL (ref 8.9–10.3)
CHLORIDE: 104 mmol/L (ref 98–111)
CO2: 31 mmol/L (ref 22–32)
Creatinine, Ser: 0.63 mg/dL (ref 0.44–1.00)
GFR calc Af Amer: >60 mL/min (ref >60)
GFR calc non Af Amer: >60 mL/min (ref >60)
GLUCOSE: 92 mg/dL (ref 70–99)
Potassium: 3.8 mmol/L (ref 3.5–5.1)
Sodium: 141 mmol/L (ref 135–145)
Total Bilirubin: 0.2 mg/dL — ABNORMAL LOW (ref 0.3–1.2)
Total Protein: 7.4 g/dL (ref 6.5–8.1)

## 2017-12-09 LAB — I-STAT BETA HCG BLOOD, ED (MC, WL, AP ONLY): I-stat hCG, quantitative: <5 m[IU]/mL (ref <5)

## 2017-12-09 LAB — LIPASE, BLOOD: LIPASE: 27 U/L (ref 11–51)

## 2017-12-09 MED ORDER — KETOROLAC TROMETHAMINE 30 MG/ML IJ SOLN
30.0000 mg | Freq: Once | INTRAMUSCULAR | Status: AC
  Administered 2017-12-09: 30 mg via INTRAVENOUS
  Filled 2017-12-09: qty 1

## 2017-12-09 MED ORDER — ALUM & MAG HYDROXIDE-SIMETH 200-200-20 MG/5ML PO SUSP
30.0000 mL | Freq: Once | ORAL | Status: AC
  Administered 2017-12-09: 30 mL via ORAL
  Filled 2017-12-09: qty 30

## 2017-12-09 MED ORDER — ONDANSETRON HCL 4 MG/2ML IJ SOLN
4.0000 mg | Freq: Once | INTRAMUSCULAR | Status: AC
  Administered 2017-12-09: 4 mg via INTRAVENOUS
  Filled 2017-12-09: qty 2

## 2017-12-09 MED ORDER — PANTOPRAZOLE SODIUM 40 MG IV SOLR
40.0000 mg | Freq: Once | INTRAVENOUS | Status: AC
  Administered 2017-12-09: 40 mg via INTRAVENOUS
  Filled 2017-12-09: qty 40

## 2017-12-09 MED ORDER — MORPHINE SULFATE (PF) 4 MG/ML IV SOLN
6.0000 mg | Freq: Once | INTRAVENOUS | Status: AC
  Administered 2017-12-09: 6 mg via INTRAVENOUS
  Filled 2017-12-09: qty 2

## 2017-12-09 NOTE — ED Notes (Signed)
Pt aware urine sample is needed 

## 2017-12-09 NOTE — ED Notes (Signed)
Patient given ginger ale. 

## 2017-12-09 NOTE — Discharge Instructions (Addendum)
You were seen in the ER for abdominal pain.  Your lab work was normal today.  Given your symptoms I have high suspicion for either ulcer disease or gastritis or acid reflux.  All of these are treated similarly.  Take omeprazole 40 mg every morning on an empty stomach, wait 20 to 30 minutes to eat after taking the medicine.  Take Pepcid 20 mg twice a day, morning and night.  Continue using Maalox before every meal and at nighttime.  The most important aspect of pain management is to avoid irritating foods and drinks.  Do not consume alcohol.  Cigarette use and nicotine is irritating to the stomach lining.  Do not take ibuprofen, tylenol, aspirin, good powders. Avoid fatty, greasy or acidic foods such as coffee, tea, tomatoes.  For further management of pain and symptoms you need to follow-up with GI as there is not much we can offer for you in the ER.  Narcotic strength type pain medications are not indicated.  Return to the ER for fevers, chills, chest pain or shortness of breath, coffee-ground/black vomit or stools.  Schedule appointment with [facility] for further discussion of your vaginal bleeding.

## 2017-12-09 NOTE — ED Notes (Signed)
ED Provider at bedside. 

## 2017-12-09 NOTE — ED Triage Notes (Signed)
Per pt, states she hs been having upper abdominal pain and heavy vaginal bleeding-states she was diagnosed with a fibroid in November-states N/V right abdominal pain for about a week-was diagnosed with GERD-using Maalox before meals and at bedtime-states she has been taking PM tylenol for insomnia and isn't sure if that is what is causing discomfort-has GYN appointment in January
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
## 2017-12-11 ENCOUNTER — Ambulatory Visit: Payer: Self-pay | Admitting: Family Medicine

## 2017-12-11 DIAGNOSIS — Z0289 Encounter for other administrative examinations: Secondary | ICD-10-CM

## 2017-12-12 ENCOUNTER — Ambulatory Visit: Payer: Self-pay | Admitting: Obstetrics & Gynecology

## 2018-01-24 ENCOUNTER — Other Ambulatory Visit: Payer: Self-pay

## 2018-01-24 ENCOUNTER — Emergency Department (HOSPITAL_COMMUNITY)
Admission: EM | Admit: 2018-01-24 | Discharge: 2018-01-24 | Disposition: A | Discharge status: Home / Self Care | Payer: BLUE CROSS/BLUE SHIELD | Attending: Emergency Medicine | Admitting: Emergency Medicine

## 2018-01-24 ENCOUNTER — Encounter (HOSPITAL_COMMUNITY): Payer: Self-pay

## 2018-01-24 DIAGNOSIS — Z79899 Other long term (current) drug therapy: Secondary | ICD-10-CM | POA: Diagnosis not present

## 2018-01-24 DIAGNOSIS — H6503 Acute serous otitis media, bilateral: Secondary | ICD-10-CM | POA: Insufficient documentation

## 2018-01-24 DIAGNOSIS — R05 Cough: Secondary | ICD-10-CM | POA: Diagnosis present

## 2018-01-24 DIAGNOSIS — Z87891 Personal history of nicotine dependence: Secondary | ICD-10-CM | POA: Diagnosis not present

## 2018-01-24 DIAGNOSIS — J40 Bronchitis, not specified as acute or chronic: Secondary | ICD-10-CM | POA: Insufficient documentation

## 2018-01-24 DIAGNOSIS — R062 Wheezing: Secondary | ICD-10-CM | POA: Insufficient documentation

## 2018-01-24 MED ORDER — PROMETHAZINE-DM 6.25-15 MG/5ML PO SYRP: 5.0000 mL | ORAL_SOLUTION | Freq: Four times a day (QID) | ORAL | 0 refills | Status: DC | PRN

## 2018-01-24 MED ORDER — DOXYCYCLINE HYCLATE 100 MG PO CAPS: 100.0000 mg | ORAL_CAPSULE | Freq: Two times a day (BID) | ORAL | 0 refills | Status: AC

## 2018-01-24 MED ORDER — DEXAMETHASONE 4 MG PO TABS
10.0000 mg | ORAL_TABLET | Freq: Once | ORAL | Status: AC
  Administered 2018-01-24: 10 mg via ORAL
  Filled 2018-01-24: qty 2

## 2018-01-24 MED ORDER — ALBUTEROL SULFATE HFA 108 (90 BASE) MCG/ACT IN AERS
2.0000 | INHALATION_SPRAY | Freq: Once | RESPIRATORY_TRACT | Status: AC
  Administered 2018-01-24: 2 via RESPIRATORY_TRACT
  Filled 2018-01-24: qty 6.7

## 2018-01-24 MED ORDER — KETOROLAC TROMETHAMINE 60 MG/2ML IM SOLN
60.0000 mg | Freq: Once | INTRAMUSCULAR | Status: AC
  Administered 2018-01-24: 60 mg via INTRAMUSCULAR
  Filled 2018-01-24: qty 2

## 2018-01-24 MED ORDER — BENZONATATE 100 MG PO CAPS: 100.0000 mg | ORAL_CAPSULE | Freq: Three times a day (TID) | ORAL | 0 refills | Status: DC | PRN
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
## 2018-01-24 NOTE — ED Notes (Signed)
Pharmacy notified about medication not available in pyxis.  Will be sending correct dosage.

## 2018-01-24 NOTE — Discharge Instructions (Addendum)
For your cough, take the medications as prescribed.  Use the inhaler every 4-6 hours as needed for wheezing.  If you need OTC meds, take the CORICIDIN HBP in place of Robitussin or Mucinex, to prevent worsening of your blood pressure

## 2018-01-24 NOTE — ED Triage Notes (Signed)
Patient c/o a productive cough with green sputum, generalized body aches, and bilateral otalgia. Patient states she has had symptoms x 1 month that have been worsening.  Patient states she has had diarrhea x 3 days.

## 2018-03-18 ENCOUNTER — Emergency Department (HOSPITAL_COMMUNITY)
Admission: EM | Admit: 2018-03-18 | Discharge: 2018-03-18 | Disposition: A | Discharge status: Home / Self Care | Payer: BLUE CROSS/BLUE SHIELD | Attending: Emergency Medicine | Admitting: Emergency Medicine

## 2018-03-18 ENCOUNTER — Other Ambulatory Visit: Payer: Self-pay

## 2018-03-18 ENCOUNTER — Encounter (HOSPITAL_COMMUNITY): Payer: Self-pay | Admitting: *Deleted

## 2018-03-18 DIAGNOSIS — L509 Urticaria, unspecified: Secondary | ICD-10-CM | POA: Insufficient documentation

## 2018-03-18 DIAGNOSIS — Z87891 Personal history of nicotine dependence: Secondary | ICD-10-CM | POA: Insufficient documentation

## 2018-03-18 DIAGNOSIS — R21 Rash and other nonspecific skin eruption: Secondary | ICD-10-CM | POA: Diagnosis present

## 2018-03-18 DIAGNOSIS — L299 Pruritus, unspecified: Secondary | ICD-10-CM | POA: Insufficient documentation

## 2018-03-18 MED ORDER — PREDNISONE 10 MG PO TABS: 40.0000 mg | ORAL_TABLET | Freq: Every day | ORAL | 0 refills | Status: AC

## 2018-03-18 MED ORDER — HYDROXYZINE HCL 25 MG PO TABS
25.0000 mg | ORAL_TABLET | Freq: Once | ORAL | Status: AC
  Administered 2018-03-18: 25 mg via ORAL
  Filled 2018-03-18: qty 1

## 2018-03-18 MED ORDER — HYDROXYZINE HCL 25 MG PO TABS: 25.0000 mg | ORAL_TABLET | Freq: Four times a day (QID) | ORAL | 0 refills | Status: DC

## 2018-03-18 NOTE — ED Notes (Signed)
Patient verbalizes understanding of discharge instructions . Opportunity for questions and answers were provided . Armband removed by staff ,Pt discharged from ED. W/C  offered at D/C  and Declined W/C at D/C and was escorted to lobby by RN.  

## 2018-03-18 NOTE — Discharge Instructions (Signed)
Take prednisone as prescribed.  Take hydroxyzine as needed for itching.  Do not take hydroxyzine and Benadryl together.  Follow-up with your primary care provider in 2 days for continued evaluation.  Return to the ED immediately for new or worsening symptoms or concerns, such as difficulty swallowing, difficulty breathing, passing out, vomiting, fevers or any concerns at all.

## 2018-03-18 NOTE — ED Triage Notes (Signed)
PT reports Hives started last night ,unsure of cause . Observed raised skin on ABD .
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
## 2018-03-30 ENCOUNTER — Other Ambulatory Visit: Payer: Self-pay

## 2018-03-30 ENCOUNTER — Emergency Department (HOSPITAL_COMMUNITY): Payer: BLUE CROSS/BLUE SHIELD

## 2018-03-30 ENCOUNTER — Emergency Department (HOSPITAL_COMMUNITY)
Admission: EM | Admit: 2018-03-30 | Discharge: 2018-03-30 | Disposition: A | Discharge status: Home / Self Care | Payer: BLUE CROSS/BLUE SHIELD | Attending: Emergency Medicine | Admitting: Emergency Medicine

## 2018-03-30 DIAGNOSIS — Y999 Unspecified external cause status: Secondary | ICD-10-CM | POA: Insufficient documentation

## 2018-03-30 DIAGNOSIS — Z79899 Other long term (current) drug therapy: Secondary | ICD-10-CM | POA: Insufficient documentation

## 2018-03-30 DIAGNOSIS — Y939 Activity, unspecified: Secondary | ICD-10-CM | POA: Insufficient documentation

## 2018-03-30 DIAGNOSIS — Y929 Unspecified place or not applicable: Secondary | ICD-10-CM | POA: Insufficient documentation

## 2018-03-30 DIAGNOSIS — Z87891 Personal history of nicotine dependence: Secondary | ICD-10-CM | POA: Insufficient documentation

## 2018-03-30 DIAGNOSIS — W108XXA Fall (on) (from) other stairs and steps, initial encounter: Secondary | ICD-10-CM

## 2018-03-30 DIAGNOSIS — R079 Chest pain, unspecified: Secondary | ICD-10-CM

## 2018-03-30 DIAGNOSIS — S93401A Sprain of unspecified ligament of right ankle, initial encounter: Secondary | ICD-10-CM | POA: Insufficient documentation

## 2018-03-30 DIAGNOSIS — W109XXA Fall (on) (from) unspecified stairs and steps, initial encounter: Secondary | ICD-10-CM | POA: Diagnosis not present

## 2018-03-30 DIAGNOSIS — R0602 Shortness of breath: Secondary | ICD-10-CM | POA: Diagnosis present

## 2018-03-30 LAB — I-STAT BETA HCG BLOOD, ED (MC, WL, AP ONLY): I-stat hCG, quantitative: <5 m[IU]/mL (ref <5)

## 2018-03-30 LAB — CBC WITH DIFFERENTIAL/PLATELET
Abs Immature Granulocytes: 0.03 10*3/uL (ref 0.00–0.07)
Basophils Absolute: 0 10*3/uL (ref 0.0–0.1)
Basophils Relative: 0 %
Eosinophils Absolute: 0.4 10*3/uL (ref 0.0–0.5)
Eosinophils Relative: 5 %
HEMATOCRIT: 36.4 % (ref 36.0–46.0)
Hemoglobin: 10.8 g/dL — ABNORMAL LOW (ref 12.0–15.0)
Immature Granulocytes: 0 %
Lymphocytes Relative: 45 %
Lymphs Abs: 3.6 10*3/uL (ref 0.7–4.0)
MCH: 23.3 pg — ABNORMAL LOW (ref 26.0–34.0)
MCHC: 29.7 g/dL — ABNORMAL LOW (ref 30.0–36.0)
MCV: 78.4 fL — ABNORMAL LOW (ref 80.0–100.0)
Monocytes Absolute: 0.6 10*3/uL (ref 0.1–1.0)
Monocytes Relative: 8 %
Neutro Abs: 3.4 10*3/uL (ref 1.7–7.7)
Neutrophils Relative %: 42 %
Platelets: 324 10*3/uL (ref 150–400)
RBC: 4.64 MIL/uL (ref 3.87–5.11)
RDW: 18.2 % — ABNORMAL HIGH (ref 11.5–15.5)
WBC: 8.1 10*3/uL (ref 4.0–10.5)
nRBC: 0 % (ref 0.0–0.2)

## 2018-03-30 LAB — COMPREHENSIVE METABOLIC PANEL
ALK PHOS: 65 U/L (ref 38–126)
ALT: 19 U/L (ref 0–44)
AST: 19 U/L (ref 15–41)
Albumin: 3.4 g/dL — ABNORMAL LOW (ref 3.5–5.0)
Anion gap: 7 (ref 5–15)
BILIRUBIN TOTAL: 0.5 mg/dL (ref 0.3–1.2)
BUN: 11 mg/dL (ref 6–20)
CALCIUM: 8.9 mg/dL (ref 8.9–10.3)
CO2: 24 mmol/L (ref 22–32)
Chloride: 104 mmol/L (ref 98–111)
Creatinine, Ser: 0.75 mg/dL (ref 0.44–1.00)
GFR calc Af Amer: >60 mL/min (ref >60)
GFR calc non Af Amer: >60 mL/min (ref >60)
Glucose, Bld: 101 mg/dL — ABNORMAL HIGH (ref 70–99)
Potassium: 3.7 mmol/L (ref 3.5–5.1)
Sodium: 135 mmol/L (ref 135–145)
TOTAL PROTEIN: 6.8 g/dL (ref 6.5–8.1)

## 2018-03-30 LAB — TROPONIN I: Troponin I: <0.03 ng/mL (ref <0.03)

## 2018-03-30 LAB — LIPASE, BLOOD: Lipase: 42 U/L (ref 11–51)

## 2018-03-30 MED ORDER — HYDROCODONE-ACETAMINOPHEN 5-325 MG PO TABS: 1.0000 | ORAL_TABLET | ORAL | 0 refills | Status: DC | PRN

## 2018-03-30 MED ORDER — HYDROCODONE-ACETAMINOPHEN 5-325 MG PO TABS
1.0000 | ORAL_TABLET | Freq: Once | ORAL | Status: AC
  Administered 2018-03-30: 1 via ORAL
  Filled 2018-03-30: qty 1

## 2018-03-30 NOTE — Discharge Instructions (Signed)
Your work-up today fortunately did not show any fractures in your ankle or foot.  There was also no evidence of rib fractures or intrathoracic injury.  Suspect soft tissue injuries in the chest wall and some soft tissue/ligamentous injury in the ankle.  Please use the ankle brace and crutches to help minimize weightbearing.  Please use the pain medicine along with your treatment at home with the rest, ice, compression, and elevation.  Please follow-up with your primary doctor.  If any symptoms change or worsen, please return to the nearest emergency department.
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
## 2018-03-30 NOTE — ED Triage Notes (Signed)
Pt reports that she had a fall down a set of steps and has had rib pain and right ankle pain since then. Pt reports that she is feeling short of breath due to the pain in her chest that worsens when she takes a deep breath.

## 2018-05-25 ENCOUNTER — Emergency Department (HOSPITAL_COMMUNITY)
Admission: EM | Admit: 2018-05-25 | Discharge: 2018-05-26 | Disposition: A | Discharge status: Home / Self Care | Payer: BLUE CROSS/BLUE SHIELD | Attending: Emergency Medicine | Admitting: Emergency Medicine

## 2018-05-25 ENCOUNTER — Encounter (HOSPITAL_COMMUNITY): Payer: Self-pay

## 2018-05-25 ENCOUNTER — Emergency Department (HOSPITAL_COMMUNITY): Payer: BLUE CROSS/BLUE SHIELD

## 2018-05-25 ENCOUNTER — Other Ambulatory Visit: Payer: Self-pay

## 2018-05-25 DIAGNOSIS — Z87891 Personal history of nicotine dependence: Secondary | ICD-10-CM | POA: Diagnosis not present

## 2018-05-25 DIAGNOSIS — R569 Unspecified convulsions: Secondary | ICD-10-CM | POA: Insufficient documentation

## 2018-05-25 DIAGNOSIS — Z79899 Other long term (current) drug therapy: Secondary | ICD-10-CM | POA: Diagnosis not present

## 2018-05-25 LAB — BASIC METABOLIC PANEL
Anion gap: 7 (ref 5–15)
BUN: 15 mg/dL (ref 6–20)
CO2: 27 mmol/L (ref 22–32)
Calcium: 9.4 mg/dL (ref 8.9–10.3)
Chloride: 106 mmol/L (ref 98–111)
Creatinine, Ser: 0.77 mg/dL (ref 0.44–1.00)
GFR calc Af Amer: >60 mL/min (ref >60)
GFR calc non Af Amer: >60 mL/min (ref >60)
Glucose, Bld: 82 mg/dL (ref 70–99)
Potassium: 3.5 mmol/L (ref 3.5–5.1)
Sodium: 140 mmol/L (ref 135–145)

## 2018-05-25 LAB — CBC WITH DIFFERENTIAL/PLATELET
Abs Immature Granulocytes: 0.03 10*3/uL (ref 0.00–0.07)
Basophils Absolute: 0 10*3/uL (ref 0.0–0.1)
Basophils Relative: 0 %
Eosinophils Absolute: 0.2 10*3/uL (ref 0.0–0.5)
Eosinophils Relative: 2 %
HCT: 36.4 % (ref 36.0–46.0)
Hemoglobin: 10.9 g/dL — ABNORMAL LOW (ref 12.0–15.0)
Immature Granulocytes: 0 %
Lymphocytes Relative: 34 %
Lymphs Abs: 3.4 10*3/uL (ref 0.7–4.0)
MCH: 23.7 pg — ABNORMAL LOW (ref 26.0–34.0)
MCHC: 29.9 g/dL — ABNORMAL LOW (ref 30.0–36.0)
MCV: 79.3 fL — ABNORMAL LOW (ref 80.0–100.0)
Monocytes Absolute: 0.8 10*3/uL (ref 0.1–1.0)
Monocytes Relative: 8 %
Neutro Abs: 5.5 10*3/uL (ref 1.7–7.7)
Neutrophils Relative %: 56 %
Platelets: 304 10*3/uL (ref 150–400)
RBC: 4.59 MIL/uL (ref 3.87–5.11)
RDW: 18.4 % — ABNORMAL HIGH (ref 11.5–15.5)
WBC: 10 10*3/uL (ref 4.0–10.5)
nRBC: 0 % (ref 0.0–0.2)

## 2018-05-25 LAB — I-STAT BETA HCG BLOOD, ED (MC, WL, AP ONLY): I-stat hCG, quantitative: <5 m[IU]/mL (ref <5)

## 2018-05-25 MED ORDER — ONDANSETRON HCL 4 MG/2ML IJ SOLN: 4.0000 mg | Freq: Once | INTRAMUSCULAR | Status: DC

## 2018-05-25 MED ORDER — ACETAMINOPHEN 500 MG PO TABS
1000.0000 mg | ORAL_TABLET | Freq: Once | ORAL | Status: AC
  Administered 2018-05-25: 1000 mg via ORAL
  Filled 2018-05-25: qty 2

## 2018-05-25 MED ORDER — LEVETIRACETAM IN NACL 1000 MG/100ML IV SOLN
1000.0000 mg | Freq: Once | INTRAVENOUS | Status: AC
  Administered 2018-05-25: 1000 mg via INTRAVENOUS
  Filled 2018-05-25: qty 100

## 2018-05-25 MED ORDER — KETOROLAC TROMETHAMINE 30 MG/ML IJ SOLN
30.0000 mg | Freq: Once | INTRAMUSCULAR | Status: AC
  Administered 2018-05-25: 30 mg via INTRAVENOUS
  Filled 2018-05-25: qty 1
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
## 2018-05-25 NOTE — ED Triage Notes (Signed)
Pt BIB [facility]. Pt has hx of epilepsy and stopped taking medications several months ago due to not liking how they made her feel. EMS reports that family found her sitting on the floor, and believed the seizure lasted around 10 min, the pt was foaming at the mouth however there were no "full body movements".   Pt is now c/o headache and states she hit the back of her head. Pt is also c/o a "jittery" feeling in the upper part of her body and does not "feel right". EMS reports pt experienced what seems to have been and absent seizure in the truck, followed by vomiting. Zofran was given at 2136.

## 2018-05-25 NOTE — ED Notes (Signed)
Bed: RD40 Expected date:  Expected time:  Means of arrival:  Comments: 11F seizure

## 2018-05-26 MED ORDER — DIPHENHYDRAMINE HCL 50 MG/ML IJ SOLN
25.0000 mg | Freq: Once | INTRAMUSCULAR | Status: AC
  Administered 2018-05-26: 25 mg via INTRAVENOUS
  Filled 2018-05-26: qty 1

## 2018-05-26 MED ORDER — LEVETIRACETAM 500 MG PO TABS: 500.0000 mg | ORAL_TABLET | Freq: Two times a day (BID) | ORAL | 0 refills | Status: DC

## 2018-05-26 NOTE — ED Notes (Signed)
Reviewed discharge instructions and medications with pt. Pt verbalizes understanding and has no questions at this time.

## 2018-05-26 NOTE — Discharge Instructions (Signed)
Take the prescribed medication as directed. Follow-up with neurology--I have requested referral to their office, I have also attached their contact information.  If you do not hear from them in the next few days contact office to follow-up on appointment. No driving until you have been seen and cleared by neurology to do so. Return to the ED for new or worsening symptoms.

## 2018-07-17 ENCOUNTER — Institutional Professional Consult (permissible substitution): Payer: BLUE CROSS/BLUE SHIELD | Admitting: Neurology

## 2018-07-17 ENCOUNTER — Telehealth: Payer: Self-pay

## 2018-07-17 NOTE — Telephone Encounter (Signed)
Pt did not show for their appt with Dr. Athar today.  

## 2018-07-21 ENCOUNTER — Other Ambulatory Visit: Payer: Self-pay

## 2018-07-21 ENCOUNTER — Emergency Department (HOSPITAL_COMMUNITY): Payer: BLUE CROSS/BLUE SHIELD

## 2018-07-21 ENCOUNTER — Emergency Department (HOSPITAL_COMMUNITY)
Admission: EM | Admit: 2018-07-21 | Discharge: 2018-07-21 | Disposition: A | Discharge status: Home / Self Care | Payer: BLUE CROSS/BLUE SHIELD | Attending: Emergency Medicine | Admitting: Emergency Medicine

## 2018-07-21 ENCOUNTER — Encounter (HOSPITAL_COMMUNITY): Payer: Self-pay

## 2018-07-21 DIAGNOSIS — R569 Unspecified convulsions: Secondary | ICD-10-CM | POA: Insufficient documentation

## 2018-07-21 DIAGNOSIS — Z87891 Personal history of nicotine dependence: Secondary | ICD-10-CM | POA: Diagnosis not present

## 2018-07-21 DIAGNOSIS — Z79899 Other long term (current) drug therapy: Secondary | ICD-10-CM | POA: Diagnosis not present

## 2018-07-21 LAB — CBC WITH DIFFERENTIAL/PLATELET
Abs Immature Granulocytes: 0.05 10*3/uL (ref 0.00–0.07)
Basophils Absolute: 0 10*3/uL (ref 0.0–0.1)
Basophils Relative: 0 %
Eosinophils Absolute: 0.4 10*3/uL (ref 0.0–0.5)
Eosinophils Relative: 5 %
HCT: 37.4 % (ref 36.0–46.0)
Hemoglobin: 11.1 g/dL — ABNORMAL LOW (ref 12.0–15.0)
Immature Granulocytes: 1 %
Lymphocytes Relative: 36 %
Lymphs Abs: 2.9 10*3/uL (ref 0.7–4.0)
MCH: 23.6 pg — ABNORMAL LOW (ref 26.0–34.0)
MCHC: 29.7 g/dL — ABNORMAL LOW (ref 30.0–36.0)
MCV: 79.4 fL — ABNORMAL LOW (ref 80.0–100.0)
Monocytes Absolute: 0.6 10*3/uL (ref 0.1–1.0)
Monocytes Relative: 8 %
Neutro Abs: 4 10*3/uL (ref 1.7–7.7)
Neutrophils Relative %: 50 %
Platelets: 295 10*3/uL (ref 150–400)
RBC: 4.71 MIL/uL (ref 3.87–5.11)
RDW: 18.4 % — ABNORMAL HIGH (ref 11.5–15.5)
WBC: 7.9 10*3/uL (ref 4.0–10.5)
nRBC: 0 % (ref 0.0–0.2)

## 2018-07-21 LAB — COMPREHENSIVE METABOLIC PANEL
ALT: 21 U/L (ref 0–44)
AST: 21 U/L (ref 15–41)
Albumin: 3.8 g/dL (ref 3.5–5.0)
Alkaline Phosphatase: 73 U/L (ref 38–126)
Anion gap: 8 (ref 5–15)
BUN: 18 mg/dL (ref 6–20)
CO2: 26 mmol/L (ref 22–32)
Calcium: 9.1 mg/dL (ref 8.9–10.3)
Chloride: 103 mmol/L (ref 98–111)
Creatinine, Ser: 0.62 mg/dL (ref 0.44–1.00)
GFR calc Af Amer: >60 mL/min (ref >60)
GFR calc non Af Amer: >60 mL/min (ref >60)
Glucose, Bld: 107 mg/dL — ABNORMAL HIGH (ref 70–99)
Potassium: 4 mmol/L (ref 3.5–5.1)
Sodium: 137 mmol/L (ref 135–145)
Total Bilirubin: 0.5 mg/dL (ref 0.3–1.2)
Total Protein: 7.7 g/dL (ref 6.5–8.1)

## 2018-07-21 LAB — CK: Total CK: 96 U/L (ref 38–234)

## 2018-07-21 LAB — HCG, QUANTITATIVE, PREGNANCY: hCG, Beta Chain, Quant, S: <1 m[IU]/mL (ref <5)

## 2018-07-21 MED ORDER — LEVETIRACETAM IN NACL 1000 MG/100ML IV SOLN
1000.0000 mg | Freq: Once | INTRAVENOUS | Status: AC
  Administered 2018-07-21: 1000 mg via INTRAVENOUS
  Filled 2018-07-21: qty 100

## 2018-07-21 MED ORDER — ONDANSETRON HCL 4 MG/2ML IJ SOLN
4.0000 mg | Freq: Once | INTRAMUSCULAR | Status: AC
  Administered 2018-07-21: 4 mg via INTRAVENOUS
  Filled 2018-07-21: qty 2

## 2018-07-21 MED ORDER — SODIUM CHLORIDE 0.9 % IV BOLUS
1000.0000 mL | Freq: Once | INTRAVENOUS | Status: AC
  Administered 2018-07-21: 1000 mL via INTRAVENOUS

## 2018-07-21 MED ORDER — DIPHENHYDRAMINE HCL 50 MG/ML IJ SOLN
25.0000 mg | Freq: Once | INTRAMUSCULAR | Status: AC
  Administered 2018-07-21: 25 mg via INTRAVENOUS
  Filled 2018-07-21: qty 1

## 2018-07-21 MED ORDER — HYDROMORPHONE HCL 1 MG/ML IJ SOLN
0.5000 mg | Freq: Once | INTRAMUSCULAR | Status: AC
  Administered 2018-07-21: 0.5 mg via INTRAVENOUS
  Filled 2018-07-21: qty 1

## 2018-07-21 NOTE — Discharge Instructions (Addendum)
Increase your Keppra so you are taking 1-1/2 pills twice a day.   Follow-up with your neurologist the next couple weeks.

## 2018-07-21 NOTE — ED Triage Notes (Signed)
He states she was recently switched to [place] for seizures. She states she has been having less severe seizures, however they are becoming more frequent. She also c/o widespread paresthesias. She is in no distress.
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
## 2018-08-21 ENCOUNTER — Other Ambulatory Visit: Payer: Self-pay

## 2018-08-21 ENCOUNTER — Emergency Department (HOSPITAL_COMMUNITY)
Admission: EM | Admit: 2018-08-21 | Discharge: 2018-08-21 | Disposition: A | Discharge status: Home / Self Care | Payer: BLUE CROSS/BLUE SHIELD | Attending: Emergency Medicine | Admitting: Emergency Medicine

## 2018-08-21 ENCOUNTER — Emergency Department (HOSPITAL_COMMUNITY): Payer: BLUE CROSS/BLUE SHIELD

## 2018-08-21 DIAGNOSIS — Y93H9 Activity, other involving exterior property and land maintenance, building and construction: Secondary | ICD-10-CM | POA: Insufficient documentation

## 2018-08-21 DIAGNOSIS — Y999 Unspecified external cause status: Secondary | ICD-10-CM | POA: Insufficient documentation

## 2018-08-21 DIAGNOSIS — G40909 Epilepsy, unspecified, not intractable, without status epilepticus: Secondary | ICD-10-CM | POA: Diagnosis not present

## 2018-08-21 DIAGNOSIS — Z79899 Other long term (current) drug therapy: Secondary | ICD-10-CM | POA: Insufficient documentation

## 2018-08-21 DIAGNOSIS — W11XXXA Fall on and from ladder, initial encounter: Secondary | ICD-10-CM | POA: Insufficient documentation

## 2018-08-21 DIAGNOSIS — Y92018 Other place in single-family (private) house as the place of occurrence of the external cause: Secondary | ICD-10-CM | POA: Insufficient documentation

## 2018-08-21 DIAGNOSIS — R51 Headache: Secondary | ICD-10-CM | POA: Diagnosis not present

## 2018-08-21 DIAGNOSIS — S161XXA Strain of muscle, fascia and tendon at neck level, initial encounter: Secondary | ICD-10-CM | POA: Diagnosis not present

## 2018-08-21 DIAGNOSIS — Z87891 Personal history of nicotine dependence: Secondary | ICD-10-CM | POA: Diagnosis not present

## 2018-08-21 DIAGNOSIS — S199XXA Unspecified injury of neck, initial encounter: Secondary | ICD-10-CM | POA: Diagnosis present

## 2018-08-21 LAB — CBC
HCT: 35.7 % — ABNORMAL LOW (ref 36.0–46.0)
Hemoglobin: 10.6 g/dL — ABNORMAL LOW (ref 12.0–15.0)
MCH: 23.6 pg — ABNORMAL LOW (ref 26.0–34.0)
MCHC: 29.7 g/dL — ABNORMAL LOW (ref 30.0–36.0)
MCV: 79.3 fL — ABNORMAL LOW (ref 80.0–100.0)
Platelets: 302 10*3/uL (ref 150–400)
RBC: 4.5 MIL/uL (ref 3.87–5.11)
RDW: 18.4 % — ABNORMAL HIGH (ref 11.5–15.5)
WBC: 7 10*3/uL (ref 4.0–10.5)
nRBC: 0 % (ref 0.0–0.2)

## 2018-08-21 LAB — BASIC METABOLIC PANEL
Anion gap: 10 (ref 5–15)
BUN: 10 mg/dL (ref 6–20)
CO2: 25 mmol/L (ref 22–32)
Calcium: 9 mg/dL (ref 8.9–10.3)
Chloride: 104 mmol/L (ref 98–111)
Creatinine, Ser: 0.6 mg/dL (ref 0.44–1.00)
GFR calc Af Amer: >60 mL/min (ref >60)
GFR calc non Af Amer: >60 mL/min (ref >60)
Glucose, Bld: 129 mg/dL — ABNORMAL HIGH (ref 70–99)
Potassium: 3.9 mmol/L (ref 3.5–5.1)
Sodium: 139 mmol/L (ref 135–145)

## 2018-08-21 LAB — CBG MONITORING, ED: Glucose-Capillary: 124 mg/dL — ABNORMAL HIGH (ref 70–99)

## 2018-08-21 LAB — I-STAT BETA HCG BLOOD, ED (MC, WL, AP ONLY): I-stat hCG, quantitative: <5 m[IU]/mL (ref <5)

## 2018-08-21 MED ORDER — ONDANSETRON 4 MG PO TBDP
4.0000 mg | ORAL_TABLET | Freq: Once | ORAL | Status: AC
  Administered 2018-08-21: 4 mg via ORAL
  Filled 2018-08-21: qty 1

## 2018-08-21 MED ORDER — HYDROMORPHONE HCL 1 MG/ML IJ SOLN
1.0000 mg | Freq: Once | INTRAMUSCULAR | Status: AC
  Administered 2018-08-21: 19:00:00 1 mg via INTRAVENOUS
  Filled 2018-08-21: qty 1

## 2018-08-21 MED ORDER — ONDANSETRON HCL 4 MG/2ML IJ SOLN: 4.0000 mg | Freq: Once | INTRAMUSCULAR | Status: DC

## 2018-08-21 MED ORDER — MORPHINE SULFATE (PF) 2 MG/ML IV SOLN
2.0000 mg | Freq: Once | INTRAVENOUS | Status: AC
  Administered 2018-08-21: 2 mg via INTRAVENOUS
  Filled 2018-08-21: qty 1

## 2018-08-21 MED ORDER — OXYCODONE HCL 5 MG PO TABS
5.0000 mg | ORAL_TABLET | Freq: Once | ORAL | Status: AC
  Administered 2018-08-21: 5 mg via ORAL
  Filled 2018-08-21: qty 1

## 2018-08-21 MED ORDER — CYCLOBENZAPRINE HCL 10 MG PO TABS: 10.0000 mg | ORAL_TABLET | Freq: Two times a day (BID) | ORAL | 0 refills | Status: DC | PRN

## 2018-08-21 MED ORDER — ACETAMINOPHEN 500 MG PO TABS: 500.0000 mg | ORAL_TABLET | Freq: Four times a day (QID) | ORAL | 0 refills | Status: DC | PRN

## 2018-08-21 MED ORDER — ACETAMINOPHEN 325 MG PO TABS
650.0000 mg | ORAL_TABLET | Freq: Once | ORAL | Status: AC
  Administered 2018-08-21: 650 mg via ORAL
  Filled 2018-08-21: qty 2

## 2018-08-21 MED ORDER — LEVETIRACETAM 500 MG PO TABS
500.0000 mg | ORAL_TABLET | Freq: Once | ORAL | Status: AC
  Administered 2018-08-21: 500 mg via ORAL
  Filled 2018-08-21: qty 1

## 2018-08-21 MED ORDER — DIPHENHYDRAMINE HCL 50 MG/ML IJ SOLN
12.5000 mg | Freq: Once | INTRAMUSCULAR | Status: AC
  Administered 2018-08-21: 12.5 mg via INTRAVENOUS
  Filled 2018-08-21: qty 1
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
## 2018-08-21 NOTE — ED Triage Notes (Addendum)
Pt here from home for seizure with hx of same this morning lasting approx 3 mins per girlfriend. Did not take Keppra this morning, but otherwise compliant. Pt was standing on a step stool painting when her hands began tingling and then her girlfriend reports that she fell and began shaking x 3 mins. Pt reports being confused like she normally is after a seizure but A/O x 4, ambulatory on arrival. Endorses burning in neck and head.

## 2018-08-21 NOTE — ED Provider Notes (Signed)
Received patient at signout from [facility].  Refer to provider note for full history and physical examination.  Briefly, patient is a 28 year old female with history of seizures presenting for evaluation after a seizure while she was on a ladder.  She had missed her morning dose of Keppra (also lowered her prescribed dose on her own without her neurologist's recommendation due to intolerance to side effects).  Pending CT scan of the head and neck further evaluation.  If reassuring, patient stable for discharge home with conservative therapy and management with Tylenol, Flexeril, heat therapy, and close neurology follow-up.  MDM  CT scan of the head and neck negative.  Patient is feeling much better.  Will discharge with course of Flexeril, discussed side effects.  Discussed conservative therapy management and close neurology follow-up.  Discussed strict ED return precautions. Patient verbalized understanding of and agreement with plan and is safe for discharge home at this time.        Renita Papa, PA-C 08/21/18 2141    Lennice Sites, DO 08/21/18 2354

## 2018-08-21 NOTE — Discharge Instructions (Addendum)
Your CT results are negative, with no sign of skull fracture, brain bleed, or injury to your cervical spine (neck) It is very important that you don't miss any doses of your keppra. You can take 1 to 2 tablets of Tylenol (350mg -1000mg  depending on the dose) every 6 hours as needed for pain.  Do not exceed 4000 mg of Tylenol daily.  You can take Flexeril as needed for muscle relaxation but do not drive, drink alcohol, or operate heavy machinery while taking this medicine as it can make you drowsy.  I usually recommend only taking this medication at night/before you are going to sleep to help relax you before going to bed.  Follow up closely with your primary care provider and discuss your visit today with your neurologist.  Return to the emergency department if any concerning signs or symptoms develop such as weakness, repeat seizure, high fevers, or loss of consciousness.

## 2018-10-12 ENCOUNTER — Emergency Department (HOSPITAL_COMMUNITY)
Admission: EM | Admit: 2018-10-12 | Discharge: 2018-10-12 | Disposition: A | Discharge status: Home / Self Care | Payer: BLUE CROSS/BLUE SHIELD | Attending: Emergency Medicine | Admitting: Emergency Medicine

## 2018-10-12 ENCOUNTER — Encounter (HOSPITAL_COMMUNITY): Payer: Self-pay | Admitting: Emergency Medicine

## 2018-10-12 ENCOUNTER — Other Ambulatory Visit: Payer: Self-pay

## 2018-10-12 DIAGNOSIS — Z79899 Other long term (current) drug therapy: Secondary | ICD-10-CM | POA: Insufficient documentation

## 2018-10-12 DIAGNOSIS — F1210 Cannabis abuse, uncomplicated: Secondary | ICD-10-CM | POA: Insufficient documentation

## 2018-10-12 DIAGNOSIS — R519 Headache, unspecified: Secondary | ICD-10-CM

## 2018-10-12 DIAGNOSIS — Z87891 Personal history of nicotine dependence: Secondary | ICD-10-CM | POA: Insufficient documentation

## 2018-10-12 MED ORDER — PROCHLORPERAZINE EDISYLATE 10 MG/2ML IJ SOLN
10.0000 mg | Freq: Once | INTRAMUSCULAR | Status: AC
  Administered 2018-10-12: 10 mg via INTRAMUSCULAR
  Filled 2018-10-12: qty 2

## 2018-10-12 MED ORDER — HYDROCODONE-ACETAMINOPHEN 5-325 MG PO TABS: 1.0000 | ORAL_TABLET | Freq: Four times a day (QID) | ORAL | 0 refills | Status: DC | PRN

## 2018-10-12 MED ORDER — LORATADINE 10 MG PO TABS: 10.0000 mg | ORAL_TABLET | Freq: Every day | ORAL | 0 refills | Status: DC

## 2018-10-12 MED ORDER — DIPHENHYDRAMINE HCL 50 MG/ML IJ SOLN
25.0000 mg | Freq: Once | INTRAMUSCULAR | Status: AC
  Administered 2018-10-12: 25 mg via INTRAMUSCULAR
  Filled 2018-10-12: qty 1
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
## 2018-10-12 NOTE — ED Notes (Signed)
Pt reports dizziness, confusion, pins/needles in arms and legs, headache for 6 days. Only relief when sleeping.

## 2018-10-12 NOTE — ED Triage Notes (Signed)
Pt. Stated, Alicia Becker had a headache for 5 days and I do have seizures so Im not sure if this is the cause. I do feel a little light headed. Sometimes I forget stuff.

## 2018-10-12 NOTE — Discharge Instructions (Signed)
1.  Take Claritin daily for the next 7 days.  Take ibuprofen 400 mg every 6-8 hours if needed for headache.  You may also take 1 Vicodin every 6 hours if needed for additional pain control. 2.  Try to rest in a darkened room over the weekend.  Schedule follow-up with your doctor early in the week. 3.  Return to the emergency department if you develop fever, double vision or loss of vision or other concerning visual changes, weakness numbness or tingling of extremities, incoordination and walking or other concerning symptoms.

## 2018-12-07 ENCOUNTER — Encounter (HOSPITAL_COMMUNITY): Payer: Self-pay

## 2018-12-07 ENCOUNTER — Emergency Department (HOSPITAL_COMMUNITY)
Admission: EM | Admit: 2018-12-07 | Discharge: 2018-12-07 | Disposition: A | Discharge status: Home / Self Care | Payer: Self-pay | Attending: Emergency Medicine | Admitting: Emergency Medicine

## 2018-12-07 ENCOUNTER — Other Ambulatory Visit: Payer: Self-pay

## 2018-12-07 DIAGNOSIS — F17210 Nicotine dependence, cigarettes, uncomplicated: Secondary | ICD-10-CM | POA: Insufficient documentation

## 2018-12-07 DIAGNOSIS — M7918 Myalgia, other site: Secondary | ICD-10-CM | POA: Insufficient documentation

## 2018-12-07 MED ORDER — METHOCARBAMOL 500 MG PO TABS: 500.0000 mg | ORAL_TABLET | Freq: Two times a day (BID) | ORAL | 0 refills | Status: DC

## 2018-12-07 NOTE — Discharge Instructions (Signed)
You can take Tylenol or Ibuprofen as directed for pain. You can alternate Tylenol and Ibuprofen every 4 hours. If you take Tylenol at 1pm, then you can take Ibuprofen at 5pm. Then you can take Tylenol again at 9pm.   Take Robaxin as prescribed. This medication will make you drowsy so do not drive or drink alcohol when taking it.  Follow up with your primary care doctor.   Return the emergency room for any worsening pain, redness or swelling starts to spread towards the shoulder, fevers, numbness/weakness or any other worse symptoms.

## 2018-12-07 NOTE — ED Notes (Signed)
L[place] PA at bedside

## 2018-12-07 NOTE — ED Notes (Signed)
Pt verbalizes understanding of DC instructions. Pt belongings returned and is ambulatory out of ED.  
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
## 2018-12-07 NOTE — ED Triage Notes (Signed)
Pt states she has a knot on her left shoulder that has gotten larger. Pt reports no relief with icy hot, ibuprofen.

## 2019-02-28 ENCOUNTER — Ambulatory Visit (HOSPITAL_COMMUNITY)
Admission: EM | Admit: 2019-02-28 | Discharge: 2019-02-28 | Disposition: A | Discharge status: Home / Self Care | Payer: Self-pay | Attending: Family Medicine | Admitting: Family Medicine

## 2019-02-28 ENCOUNTER — Other Ambulatory Visit: Payer: Self-pay

## 2019-02-28 ENCOUNTER — Ambulatory Visit (INDEPENDENT_AMBULATORY_CARE_PROVIDER_SITE_OTHER): Payer: Self-pay

## 2019-02-28 ENCOUNTER — Encounter (HOSPITAL_COMMUNITY): Payer: Self-pay

## 2019-02-28 DIAGNOSIS — R0602 Shortness of breath: Secondary | ICD-10-CM | POA: Insufficient documentation

## 2019-02-28 DIAGNOSIS — Z885 Allergy status to narcotic agent status: Secondary | ICD-10-CM | POA: Insufficient documentation

## 2019-02-28 DIAGNOSIS — J40 Bronchitis, not specified as acute or chronic: Secondary | ICD-10-CM | POA: Insufficient documentation

## 2019-02-28 DIAGNOSIS — M940 Chondrocostal junction syndrome [Tietze]: Secondary | ICD-10-CM

## 2019-02-28 DIAGNOSIS — J069 Acute upper respiratory infection, unspecified: Secondary | ICD-10-CM

## 2019-02-28 DIAGNOSIS — J019 Acute sinusitis, unspecified: Secondary | ICD-10-CM | POA: Insufficient documentation

## 2019-02-28 DIAGNOSIS — Z79899 Other long term (current) drug therapy: Secondary | ICD-10-CM | POA: Insufficient documentation

## 2019-02-28 DIAGNOSIS — Z20822 Contact with and (suspected) exposure to covid-19: Secondary | ICD-10-CM | POA: Insufficient documentation

## 2019-02-28 DIAGNOSIS — Z3202 Encounter for pregnancy test, result negative: Secondary | ICD-10-CM

## 2019-02-28 DIAGNOSIS — Z886 Allergy status to analgesic agent status: Secondary | ICD-10-CM | POA: Insufficient documentation

## 2019-02-28 DIAGNOSIS — Z87891 Personal history of nicotine dependence: Secondary | ICD-10-CM | POA: Insufficient documentation

## 2019-02-28 LAB — POCT PREGNANCY, URINE: Preg Test, Ur: NEGATIVE

## 2019-02-28 LAB — POC URINE PREG, ED
Preg Test, Ur: NEGATIVE
Preg Test, Ur: NEGATIVE

## 2019-02-28 MED ORDER — BENZONATATE 100 MG PO CAPS: 100.0000 mg | ORAL_CAPSULE | Freq: Three times a day (TID) | ORAL | 0 refills | Status: DC

## 2019-02-28 MED ORDER — DEXAMETHASONE SODIUM PHOSPHATE 10 MG/ML IJ SOLN
10.0000 mg | Freq: Once | INTRAMUSCULAR | Status: AC
  Administered 2019-02-28: 10:00:00 10 mg via INTRAMUSCULAR

## 2019-02-28 MED ORDER — PREDNISONE 10 MG (21) PO TBPK: ORAL_TABLET | Freq: Every day | ORAL | 0 refills | Status: DC

## 2019-02-28 MED ORDER — PREDNISONE 10 MG (21) PO TBPK: ORAL_TABLET | Freq: Every day | ORAL | 0 refills | Status: AC

## 2019-02-28 MED ORDER — AMOXICILLIN 875 MG PO TABS: 875.0000 mg | ORAL_TABLET | Freq: Two times a day (BID) | ORAL | 0 refills | Status: AC

## 2019-02-28 MED ORDER — DEXAMETHASONE SODIUM PHOSPHATE 10 MG/ML IJ SOLN
INTRAMUSCULAR | Status: AC
  Filled 2019-02-28: qty 1

## 2019-02-28 MED ORDER — ALBUTEROL SULFATE HFA 108 (90 BASE) MCG/ACT IN AERS: 2.0000 | INHALATION_SPRAY | RESPIRATORY_TRACT | 0 refills | Status: AC | PRN

## 2019-02-28 NOTE — ED Triage Notes (Signed)
Pt is here with SOB & wheezing that started 2 weeks ago. States she has taken Tylenol & Dayquil. She was tested Monday for COVID her results were NEGATIVE.
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
## 2019-02-28 NOTE — Discharge Instructions (Addendum)
Your COVID test is pending.  You should self quarantine until your test result is back and is negative.    Take Tylenol as needed for fever or discomfort.  Rest and keep yourself hydrated.    You have received a steroid shot in the office today. I have sent in oral prednisone and an antibiotic. Take all of the medication.  Go to the emergency department if you develop high fever, shortness of breath, severe diarrhea, or other concerning symptoms.

## 2019-03-01 LAB — NOVEL CORONAVIRUS, NAA (HOSP ORDER, SEND-OUT TO REF LAB; TAT 18-24 HRS): SARS-CoV-2, NAA: NOT DETECTED

## 2019-04-16 ENCOUNTER — Other Ambulatory Visit: Payer: Self-pay

## 2019-04-16 ENCOUNTER — Ambulatory Visit (HOSPITAL_COMMUNITY)
Admission: EM | Admit: 2019-04-16 | Discharge: 2019-04-16 | Disposition: A | Discharge status: Home / Self Care | Payer: Self-pay | Attending: Urgent Care | Admitting: Urgent Care

## 2019-04-16 ENCOUNTER — Ambulatory Visit (INDEPENDENT_AMBULATORY_CARE_PROVIDER_SITE_OTHER): Payer: Self-pay

## 2019-04-16 ENCOUNTER — Encounter (HOSPITAL_COMMUNITY): Payer: Self-pay

## 2019-04-16 DIAGNOSIS — Z3202 Encounter for pregnancy test, result negative: Secondary | ICD-10-CM

## 2019-04-16 DIAGNOSIS — M25532 Pain in left wrist: Secondary | ICD-10-CM

## 2019-04-16 DIAGNOSIS — W19XXXA Unspecified fall, initial encounter: Secondary | ICD-10-CM

## 2019-04-16 DIAGNOSIS — S60212A Contusion of left wrist, initial encounter: Secondary | ICD-10-CM

## 2019-04-16 DIAGNOSIS — M25432 Effusion, left wrist: Secondary | ICD-10-CM

## 2019-04-16 LAB — POC URINE PREG, ED: Preg Test, Ur: NEGATIVE

## 2019-04-16 LAB — POCT PREGNANCY, URINE: Preg Test, Ur: NEGATIVE

## 2019-04-16 MED ORDER — PREDNISONE 10 MG PO TABS: 30.0000 mg | ORAL_TABLET | Freq: Every day | ORAL | 0 refills | Status: DC

## 2019-04-16 NOTE — ED Triage Notes (Signed)
Pt presents with left wrist pain X 2 weeks after a fall injury; pt states she has had a sharp throbbing pain in wrist , she states she has taken OTC medication  and used a brace and that has not given relief.
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
## 2019-06-10 ENCOUNTER — Encounter (HOSPITAL_COMMUNITY): Payer: Self-pay | Admitting: Emergency Medicine

## 2019-06-10 ENCOUNTER — Ambulatory Visit (HOSPITAL_COMMUNITY)
Admission: EM | Admit: 2019-06-10 | Discharge: 2019-06-10 | Disposition: A | Discharge status: Home / Self Care | Payer: Self-pay | Attending: Family Medicine | Admitting: Family Medicine

## 2019-06-10 ENCOUNTER — Other Ambulatory Visit: Payer: Self-pay

## 2019-06-10 DIAGNOSIS — K047 Periapical abscess without sinus: Secondary | ICD-10-CM

## 2019-06-10 DIAGNOSIS — K0889 Other specified disorders of teeth and supporting structures: Secondary | ICD-10-CM

## 2019-06-10 MED ORDER — PENICILLIN V POTASSIUM 500 MG PO TABS: 500.0000 mg | ORAL_TABLET | Freq: Four times a day (QID) | ORAL | 1 refills | Status: AC

## 2019-06-10 MED ORDER — HYDROCODONE-ACETAMINOPHEN 7.5-325 MG PO TABS: 1.0000 | ORAL_TABLET | Freq: Four times a day (QID) | ORAL | 0 refills | Status: DC | PRN
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
## 2019-06-10 NOTE — Discharge Instructions (Signed)
Take the antibiotic 4 times a day Take Norco with food Do not drive on the hydrocodone See your dentist next week I gave you a refill on the antibiotic in case it is needed

## 2019-06-10 NOTE — ED Triage Notes (Signed)
PT chipped a tooth last week (right lower jaw)  and reports abscess formed at site. She has used ibuprofen OTC. Per dentist, she needs antibiotics for removal of tooth.

## 2020-02-17 ENCOUNTER — Other Ambulatory Visit: Payer: Self-pay

## 2020-02-17 ENCOUNTER — Encounter (HOSPITAL_BASED_OUTPATIENT_CLINIC_OR_DEPARTMENT_OTHER): Payer: Self-pay | Admitting: *Deleted

## 2020-02-17 ENCOUNTER — Emergency Department (HOSPITAL_BASED_OUTPATIENT_CLINIC_OR_DEPARTMENT_OTHER)
Admission: EM | Admit: 2020-02-17 | Discharge: 2020-02-17 | Disposition: A | Discharge status: Home / Self Care | Payer: Self-pay | Attending: Emergency Medicine | Admitting: Emergency Medicine

## 2020-02-17 ENCOUNTER — Emergency Department (HOSPITAL_BASED_OUTPATIENT_CLINIC_OR_DEPARTMENT_OTHER): Payer: Self-pay

## 2020-02-17 ENCOUNTER — Emergency Department (HOSPITAL_COMMUNITY): Admission: EM | Admit: 2020-02-17 | Discharge: 2020-02-17 | Discharge status: Absent without leave | Payer: Self-pay

## 2020-02-17 DIAGNOSIS — M79605 Pain in left leg: Secondary | ICD-10-CM

## 2020-02-17 DIAGNOSIS — M79662 Pain in left lower leg: Secondary | ICD-10-CM | POA: Insufficient documentation

## 2020-02-17 DIAGNOSIS — Z87891 Personal history of nicotine dependence: Secondary | ICD-10-CM | POA: Insufficient documentation

## 2020-02-17 DIAGNOSIS — M79606 Pain in leg, unspecified: Secondary | ICD-10-CM

## 2020-02-17 MED ORDER — CYCLOBENZAPRINE HCL 10 MG PO TABS
10.0000 mg | ORAL_TABLET | Freq: Once | ORAL | Status: AC
  Administered 2020-02-17: 10 mg via ORAL
  Filled 2020-02-17: qty 1

## 2020-02-17 MED ORDER — KETOROLAC TROMETHAMINE 60 MG/2ML IM SOLN
15.0000 mg | Freq: Once | INTRAMUSCULAR | Status: DC
  Filled 2020-02-17: qty 2

## 2020-02-17 MED ORDER — KETOROLAC TROMETHAMINE 15 MG/ML IJ SOLN: 15.0000 mg | Freq: Once | INTRAMUSCULAR | Status: DC

## 2020-02-17 MED ORDER — CYCLOBENZAPRINE HCL 10 MG PO TABS
10.0000 mg | ORAL_TABLET | Freq: Two times a day (BID) | ORAL | 0 refills | Status: DC | PRN
Start: 2020-02-17 — End: 2020-12-13

## 2020-02-17 NOTE — ED Notes (Signed)
US at bedside

## 2020-02-17 NOTE — Discharge Instructions (Signed)
You were seen here for leg pain.  Exam and imaging all looks reassuring.  Started you on muscle relaxers please take as prescribed, please beware this medication can make you drowsy do not consume alcohol or operate heavy machinery while taking this medication.  You may also take ibuprofen and or Tylenol every 6 hours as needed please follow dosing back of bottle.  Please follow-up with sports medicine and/or podiatry for further evaluation, and given the contact above.  Come back to the emergency department if you develop chest pain, shortness of breath, severe abdominal pain, uncontrolled nausea, vomiting, diarrhea.

## 2020-02-17 NOTE — ED Notes (Signed)
Attempted to get vitals, but [place] present and doing exam. Will obtain vitals later.

## 2020-02-17 NOTE — ED Triage Notes (Signed)
Began having bilateral leg pain, having difficulty walking, has tried Biofreeze, tylenol, epsom salt. Even touching legs has pain. Having difficulty sleeping due to pain, utilizes ice packs at times. Onset for the past 3 weeks. Bilateral ankle swelling is visibly swollen

## 2020-02-17 NOTE — ED Notes (Signed)
ED Provider at bedside. 
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
## 2020-05-05 ENCOUNTER — Emergency Department (HOSPITAL_BASED_OUTPATIENT_CLINIC_OR_DEPARTMENT_OTHER): Payer: Self-pay

## 2020-05-05 ENCOUNTER — Encounter (HOSPITAL_BASED_OUTPATIENT_CLINIC_OR_DEPARTMENT_OTHER): Payer: Self-pay

## 2020-05-05 ENCOUNTER — Other Ambulatory Visit: Payer: Self-pay

## 2020-05-05 ENCOUNTER — Emergency Department (HOSPITAL_BASED_OUTPATIENT_CLINIC_OR_DEPARTMENT_OTHER)
Admission: EM | Admit: 2020-05-05 | Discharge: 2020-05-05 | Disposition: A | Discharge status: Home / Self Care | Payer: Self-pay | Attending: Emergency Medicine | Admitting: Emergency Medicine

## 2020-05-05 DIAGNOSIS — I4581 Long QT syndrome: Secondary | ICD-10-CM | POA: Insufficient documentation

## 2020-05-05 DIAGNOSIS — Z20822 Contact with and (suspected) exposure to covid-19: Secondary | ICD-10-CM | POA: Insufficient documentation

## 2020-05-05 DIAGNOSIS — Z87891 Personal history of nicotine dependence: Secondary | ICD-10-CM | POA: Insufficient documentation

## 2020-05-05 DIAGNOSIS — R9431 Abnormal electrocardiogram [ECG] [EKG]: Secondary | ICD-10-CM

## 2020-05-05 DIAGNOSIS — J4521 Mild intermittent asthma with (acute) exacerbation: Secondary | ICD-10-CM | POA: Insufficient documentation

## 2020-05-05 DIAGNOSIS — J40 Bronchitis, not specified as acute or chronic: Secondary | ICD-10-CM | POA: Insufficient documentation

## 2020-05-05 LAB — COMPREHENSIVE METABOLIC PANEL
ALT: 19 U/L (ref 0–44)
AST: 18 U/L (ref 15–41)
Albumin: 3.5 g/dL (ref 3.5–5.0)
Alkaline Phosphatase: 62 U/L (ref 38–126)
Anion gap: 9 (ref 5–15)
BUN: 12 mg/dL (ref 6–20)
CO2: 27 mmol/L (ref 22–32)
Calcium: 8.8 mg/dL — ABNORMAL LOW (ref 8.9–10.3)
Chloride: 101 mmol/L (ref 98–111)
Creatinine, Ser: 0.69 mg/dL (ref 0.44–1.00)
GFR, Estimated: >60 mL/min (ref >60)
Glucose, Bld: 126 mg/dL — ABNORMAL HIGH (ref 70–99)
Potassium: 3.2 mmol/L — ABNORMAL LOW (ref 3.5–5.1)
Sodium: 137 mmol/L (ref 135–145)
Total Bilirubin: 0.3 mg/dL (ref 0.3–1.2)
Total Protein: 7 g/dL (ref 6.5–8.1)

## 2020-05-05 LAB — CBC WITH DIFFERENTIAL/PLATELET
Abs Immature Granulocytes: 0.03 10*3/uL (ref 0.00–0.07)
Basophils Absolute: 0 10*3/uL (ref 0.0–0.1)
Basophils Relative: 0 %
Eosinophils Absolute: 0.2 10*3/uL (ref 0.0–0.5)
Eosinophils Relative: 3 %
HCT: 37 % (ref 36.0–46.0)
Hemoglobin: 11.2 g/dL — ABNORMAL LOW (ref 12.0–15.0)
Immature Granulocytes: 1 %
Lymphocytes Relative: 38 %
Lymphs Abs: 2.5 10*3/uL (ref 0.7–4.0)
MCH: 24.6 pg — ABNORMAL LOW (ref 26.0–34.0)
MCHC: 30.3 g/dL (ref 30.0–36.0)
MCV: 81.3 fL (ref 80.0–100.0)
Monocytes Absolute: 0.5 10*3/uL (ref 0.1–1.0)
Monocytes Relative: 8 %
Neutro Abs: 3.2 10*3/uL (ref 1.7–7.7)
Neutrophils Relative %: 50 %
Platelets: 262 10*3/uL (ref 150–400)
RBC: 4.55 MIL/uL (ref 3.87–5.11)
RDW: 16.9 % — ABNORMAL HIGH (ref 11.5–15.5)
WBC: 6.5 10*3/uL (ref 4.0–10.5)
nRBC: 0 % (ref 0.0–0.2)

## 2020-05-05 LAB — RESP PANEL BY RT-PCR (FLU A&B, COVID) ARPGX2
Influenza A by PCR: NEGATIVE
Influenza B by PCR: NEGATIVE
SARS Coronavirus 2 by RT PCR: NEGATIVE

## 2020-05-05 LAB — PREGNANCY, URINE: Preg Test, Ur: NEGATIVE

## 2020-05-05 LAB — D-DIMER, QUANTITATIVE: D-Dimer, Quant: 0.57 ug/mL-FEU — ABNORMAL HIGH (ref 0.00–0.50)

## 2020-05-05 MED ORDER — METHYLPREDNISOLONE SODIUM SUCC 125 MG IJ SOLR
125.0000 mg | Freq: Once | INTRAMUSCULAR | Status: AC
  Administered 2020-05-05: 125 mg via INTRAVENOUS
  Filled 2020-05-05: qty 2

## 2020-05-05 MED ORDER — IOHEXOL 350 MG/ML SOLN
100.0000 mL | Freq: Once | INTRAVENOUS | Status: AC | PRN
  Administered 2020-05-05: 100 mL via INTRAVENOUS

## 2020-05-05 MED ORDER — POTASSIUM CHLORIDE CRYS ER 20 MEQ PO TBCR
40.0000 meq | EXTENDED_RELEASE_TABLET | Freq: Once | ORAL | Status: AC
  Administered 2020-05-05: 40 meq via ORAL
  Filled 2020-05-05: qty 2

## 2020-05-05 MED ORDER — MORPHINE SULFATE (PF) 4 MG/ML IV SOLN
4.0000 mg | Freq: Once | INTRAVENOUS | Status: AC
  Administered 2020-05-05: 4 mg via INTRAVENOUS
  Filled 2020-05-05: qty 1

## 2020-05-05 MED ORDER — ALBUTEROL SULFATE HFA 108 (90 BASE) MCG/ACT IN AERS: 4.0000 | INHALATION_SPRAY | Freq: Once | RESPIRATORY_TRACT | Status: AC

## 2020-05-05 MED ORDER — FENTANYL CITRATE (PF) 100 MCG/2ML IJ SOLN
50.0000 ug | Freq: Once | INTRAMUSCULAR | Status: AC
  Administered 2020-05-05: 50 ug via INTRAVENOUS
  Filled 2020-05-05: qty 2

## 2020-05-05 MED ORDER — ALBUTEROL SULFATE HFA 108 (90 BASE) MCG/ACT IN AERS
INHALATION_SPRAY | RESPIRATORY_TRACT | Status: AC
  Administered 2020-05-05: 4
  Filled 2020-05-05: qty 6.7

## 2020-05-05 MED ORDER — BENZONATATE 100 MG PO CAPS: 100.0000 mg | ORAL_CAPSULE | Freq: Three times a day (TID) | ORAL | 0 refills | Status: DC

## 2020-05-05 MED ORDER — IPRATROPIUM BROMIDE HFA 17 MCG/ACT IN AERS
2.0000 | INHALATION_SPRAY | Freq: Once | RESPIRATORY_TRACT | Status: AC
  Administered 2020-05-05: 2 via RESPIRATORY_TRACT
  Filled 2020-05-05: qty 12.9

## 2020-05-05 MED ORDER — PREDNISONE 20 MG PO TABS: 40.0000 mg | ORAL_TABLET | Freq: Every day | ORAL | 0 refills | Status: AC

## 2020-05-05 NOTE — ED Notes (Signed)
Portable xray at bedside.

## 2020-05-05 NOTE — ED Notes (Signed)
Pt given Potassium with some apple sauce and given ginger ale PPR

## 2020-05-05 NOTE — ED Notes (Signed)
Patient transported to CT 
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
## 2020-05-05 NOTE — ED Notes (Signed)
Pt reports ongoing pain, little improvement from pain medication.  Provider made aware

## 2020-05-05 NOTE — Discharge Instructions (Signed)
  You were evaluated in the Emergency Department and after careful evaluation, we did not find any emergent condition requiring admission or further testing in the hospital.   Your exam/testing today was overall reassuring.  Symptoms seem to be due to chronic bronchitis in addition to asthma exacerbation.  I want you to take the steroids as directed, as wants to take albuterol 2 puffs every 6 hours as needed, I want to take Atrovent 2 puffs at night for the next couple of days.  You can take Tylenol as directed on the bottle for pain.  I also want you to see a pulmonologist for your asthma, I did refer you to 1, they should call you within the next couple of weeks.  If you have any new worsening concerning symptoms such as worsening shortness of breath, difficulty breathing or chest pain please come back to the emergency department.  Please use the attached instructions. Please return to the Emergency Department if you experience any worsening of your condition.  Thank you for allowing [place] to be a part of your care. Please speak to your pharmacist about any new medications prescribed today in regards to side effects or interactions with other medications.    In addition your EKG did show some prolonged QTC, which you to follow-up with cardiology in regards to this, you can schedule an appointment.

## 2020-05-05 NOTE — Progress Notes (Signed)
Pt assessed for shortness of breath. Upon arrival to pt room, pt able to speak in full sentences but endorses pain in her back, cough and inability to take a deep breath. Bilateral breath sounds diminished with faint expiratory wheezes. Pt was instructed on use of MDI with spacer and 4puffs of Albuterol were given. Pt tolerated well. RT will continue to monitor and be available as needed.

## 2020-05-05 NOTE — ED Triage Notes (Signed)
Pt states cough x 5 weeks, using otc cough, some relief, past few days, pain with cough and deep breathing.  Denies hx asthma.  Denies fevers.  Tested for covid weekly negative as of one week ago.

## 2020-05-05 NOTE — ED Notes (Signed)
Pt ambulating in room with oxygen sats remaining 100% rm air

## 2020-06-16 ENCOUNTER — Emergency Department (HOSPITAL_BASED_OUTPATIENT_CLINIC_OR_DEPARTMENT_OTHER)
Admission: EM | Admit: 2020-06-16 | Discharge: 2020-06-16 | Disposition: A | Discharge status: Home / Self Care | Payer: Self-pay | Attending: Emergency Medicine | Admitting: Emergency Medicine

## 2020-06-16 ENCOUNTER — Emergency Department (HOSPITAL_BASED_OUTPATIENT_CLINIC_OR_DEPARTMENT_OTHER): Payer: Self-pay

## 2020-06-16 ENCOUNTER — Encounter (HOSPITAL_BASED_OUTPATIENT_CLINIC_OR_DEPARTMENT_OTHER): Payer: Self-pay | Admitting: Emergency Medicine

## 2020-06-16 ENCOUNTER — Other Ambulatory Visit: Payer: Self-pay

## 2020-06-16 DIAGNOSIS — W19XXXA Unspecified fall, initial encounter: Secondary | ICD-10-CM

## 2020-06-16 DIAGNOSIS — W174XXA Fall from dock, initial encounter: Secondary | ICD-10-CM | POA: Insufficient documentation

## 2020-06-16 DIAGNOSIS — R079 Chest pain, unspecified: Secondary | ICD-10-CM | POA: Insufficient documentation

## 2020-06-16 DIAGNOSIS — Y990 Civilian activity done for income or pay: Secondary | ICD-10-CM | POA: Insufficient documentation

## 2020-06-16 DIAGNOSIS — M546 Pain in thoracic spine: Secondary | ICD-10-CM | POA: Insufficient documentation

## 2020-06-16 DIAGNOSIS — Z87891 Personal history of nicotine dependence: Secondary | ICD-10-CM | POA: Insufficient documentation

## 2020-06-16 DIAGNOSIS — M545 Low back pain, unspecified: Secondary | ICD-10-CM | POA: Insufficient documentation

## 2020-06-16 DIAGNOSIS — M549 Dorsalgia, unspecified: Secondary | ICD-10-CM

## 2020-06-16 LAB — PREGNANCY, URINE: Preg Test, Ur: NEGATIVE

## 2020-06-16 MED ORDER — OXYCODONE HCL 5 MG PO TABS: 5.0000 mg | ORAL_TABLET | Freq: Four times a day (QID) | ORAL | 0 refills | Status: DC | PRN

## 2020-06-16 MED ORDER — LIDOCAINE 5 % EX PTCH: 1.0000 | MEDICATED_PATCH | CUTANEOUS | 0 refills | Status: DC

## 2020-06-16 MED ORDER — OXYCODONE-ACETAMINOPHEN 5-325 MG PO TABS
1.0000 | ORAL_TABLET | Freq: Once | ORAL | Status: AC
  Administered 2020-06-16: 1 via ORAL
  Filled 2020-06-16: qty 1

## 2020-06-16 NOTE — Discharge Instructions (Addendum)
X-rays show no acute fracture or injuries.  Take 1000 mg of Tylenol every 6 hours as needed for pain.  Take ibuprofen 600 mg every 8 hours as needed for pain.  Take Roxicodone for breakthrough pain, this is a narcotic pain medicine, please be careful with using this medicine as it can make you sedated.  Do not use while driving or doing any dangerous activities.  Consider lidocaine patches as well.
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
## 2020-06-16 NOTE — ED Triage Notes (Signed)
Pt c/o mid back pain since falling off loading dock at work while having a seizure x 2 weeks ago.

## 2020-06-16 NOTE — ED Provider Notes (Signed)
Patient signed out to me at 7 AM.  Here with back pain after fall several weeks ago.  Awaiting x-rays.  X-rays show no acute fracture.  Prescribed lidocaine patch, Roxicodone for breakthrough pain.  Recommend Tylenol Motrin.  Discharged in good condition.   Lennice Sites, DO 06/16/20 [phone]

## 2020-10-16 ENCOUNTER — Other Ambulatory Visit: Payer: Self-pay

## 2020-10-16 ENCOUNTER — Emergency Department (HOSPITAL_BASED_OUTPATIENT_CLINIC_OR_DEPARTMENT_OTHER): Payer: BC Managed Care – PPO

## 2020-10-16 ENCOUNTER — Emergency Department (HOSPITAL_BASED_OUTPATIENT_CLINIC_OR_DEPARTMENT_OTHER)
Admission: EM | Admit: 2020-10-16 | Discharge: 2020-10-16 | Disposition: A | Discharge status: Home / Self Care | Payer: BC Managed Care – PPO | Attending: Emergency Medicine | Admitting: Emergency Medicine

## 2020-10-16 ENCOUNTER — Encounter (HOSPITAL_BASED_OUTPATIENT_CLINIC_OR_DEPARTMENT_OTHER): Payer: Self-pay

## 2020-10-16 DIAGNOSIS — Z87891 Personal history of nicotine dependence: Secondary | ICD-10-CM | POA: Diagnosis not present

## 2020-10-16 DIAGNOSIS — R2241 Localized swelling, mass and lump, right lower limb: Secondary | ICD-10-CM | POA: Insufficient documentation

## 2020-10-16 DIAGNOSIS — M79661 Pain in right lower leg: Secondary | ICD-10-CM | POA: Diagnosis present

## 2020-10-16 DIAGNOSIS — M79604 Pain in right leg: Secondary | ICD-10-CM

## 2020-10-16 MED ORDER — DICLOFENAC SODIUM 75 MG PO TBEC: 75.0000 mg | DELAYED_RELEASE_TABLET | Freq: Two times a day (BID) | ORAL | 0 refills | Status: DC

## 2020-10-16 MED ORDER — HYDROCODONE-ACETAMINOPHEN 5-325 MG PO TABS
2.0000 | ORAL_TABLET | Freq: Once | ORAL | Status: AC
  Administered 2020-10-16: 2 via ORAL
  Filled 2020-10-16: qty 2

## 2020-10-16 NOTE — ED Notes (Signed)
Pt NAD, a/ox4. Pt verbalizes understanding of all DC and f/u instructions. All questions answered. Pt walks with steady gait to lobby at DC.  ? ?

## 2020-10-16 NOTE — ED Triage Notes (Signed)
Pt c/o pain to right lower leg x 1 month, states there "is a lump". Family hx of DVT.

## 2020-10-16 NOTE — Discharge Instructions (Addendum)
Return if any problems.
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
## 2020-12-04 ENCOUNTER — Encounter (HOSPITAL_COMMUNITY): Payer: Self-pay

## 2020-12-04 ENCOUNTER — Encounter (HOSPITAL_BASED_OUTPATIENT_CLINIC_OR_DEPARTMENT_OTHER): Payer: Self-pay

## 2020-12-04 ENCOUNTER — Emergency Department (HOSPITAL_COMMUNITY)
Admission: EM | Admit: 2020-12-04 | Discharge: 2020-12-04 | Disposition: A | Discharge status: Home / Self Care | Payer: BC Managed Care – PPO | Attending: Emergency Medicine | Admitting: Emergency Medicine

## 2020-12-04 ENCOUNTER — Other Ambulatory Visit: Payer: Self-pay

## 2020-12-04 ENCOUNTER — Emergency Department (HOSPITAL_BASED_OUTPATIENT_CLINIC_OR_DEPARTMENT_OTHER)
Admission: EM | Admit: 2020-12-04 | Discharge: 2020-12-04 | Disposition: A | Discharge status: Home / Self Care | Payer: BC Managed Care – PPO | Source: Home / Self Care | Attending: Emergency Medicine | Admitting: Emergency Medicine

## 2020-12-04 DIAGNOSIS — Z79899 Other long term (current) drug therapy: Secondary | ICD-10-CM | POA: Insufficient documentation

## 2020-12-04 DIAGNOSIS — R6884 Jaw pain: Secondary | ICD-10-CM | POA: Diagnosis present

## 2020-12-04 DIAGNOSIS — K0889 Other specified disorders of teeth and supporting structures: Secondary | ICD-10-CM

## 2020-12-04 DIAGNOSIS — Z87891 Personal history of nicotine dependence: Secondary | ICD-10-CM | POA: Insufficient documentation

## 2020-12-04 DIAGNOSIS — K029 Dental caries, unspecified: Secondary | ICD-10-CM | POA: Insufficient documentation

## 2020-12-04 DIAGNOSIS — Z5321 Procedure and treatment not carried out due to patient leaving prior to being seen by health care provider: Secondary | ICD-10-CM | POA: Insufficient documentation

## 2020-12-04 LAB — BASIC METABOLIC PANEL
Anion gap: 3 — ABNORMAL LOW (ref 5–15)
BUN: 13 mg/dL (ref 6–20)
CO2: 29 mmol/L (ref 22–32)
Calcium: 8.6 mg/dL — ABNORMAL LOW (ref 8.9–10.3)
Chloride: 104 mmol/L (ref 98–111)
Creatinine, Ser: 0.67 mg/dL (ref 0.44–1.00)
GFR, Estimated: >60 mL/min (ref >60)
Glucose, Bld: 129 mg/dL — ABNORMAL HIGH (ref 70–99)
Potassium: 3.7 mmol/L (ref 3.5–5.1)
Sodium: 136 mmol/L (ref 135–145)

## 2020-12-04 LAB — CBC
HCT: 37.5 % (ref 36.0–46.0)
Hemoglobin: 11.2 g/dL — ABNORMAL LOW (ref 12.0–15.0)
MCH: 24.2 pg — ABNORMAL LOW (ref 26.0–34.0)
MCHC: 29.9 g/dL — ABNORMAL LOW (ref 30.0–36.0)
MCV: 81 fL (ref 80.0–100.0)
Platelets: 330 10*3/uL (ref 150–400)
RBC: 4.63 MIL/uL (ref 3.87–5.11)
RDW: 18.1 % — ABNORMAL HIGH (ref 11.5–15.5)
WBC: 8.3 10*3/uL (ref 4.0–10.5)
nRBC: 0 % (ref 0.0–0.2)

## 2020-12-04 MED ORDER — CLINDAMYCIN HCL 300 MG PO CAPS: 300.0000 mg | ORAL_CAPSULE | Freq: Three times a day (TID) | ORAL | 0 refills | Status: DC

## 2020-12-04 MED ORDER — OXYCODONE HCL 5 MG PO TABS: 5.0000 mg | ORAL_TABLET | Freq: Four times a day (QID) | ORAL | 0 refills | Status: DC | PRN

## 2020-12-04 MED ORDER — OXYCODONE HCL 5 MG PO TABS
5.0000 mg | ORAL_TABLET | Freq: Once | ORAL | Status: AC
  Administered 2020-12-04: 5 mg via ORAL
  Filled 2020-12-04: qty 1
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
## 2020-12-04 NOTE — ED Triage Notes (Signed)
Patient arrived with jaw pain/swelling on her right side. Located in her right lower molars. She said she cannot drink anything without it hurting. This jaw pain/swelling has been going on for 4 weeks. Patient awaiting to see her dentist.

## 2020-12-04 NOTE — ED Triage Notes (Signed)
Pt has jaw pain/swelling in right lower molars x 4 weeks. Pt states swelling has increased and is going down face.  Pt c/o pain in right jaw 10/10, throbbing.  Pt started on antibiotics 11 days ago (amoxicillin).  Pt waiting to get into see dentist.

## 2020-12-10 ENCOUNTER — Emergency Department (HOSPITAL_BASED_OUTPATIENT_CLINIC_OR_DEPARTMENT_OTHER)
Admission: EM | Admit: 2020-12-10 | Discharge: 2020-12-10 | Disposition: A | Discharge status: Home / Self Care | Payer: BC Managed Care – PPO | Source: Home / Self Care | Attending: Emergency Medicine | Admitting: Emergency Medicine

## 2020-12-10 ENCOUNTER — Encounter (HOSPITAL_BASED_OUTPATIENT_CLINIC_OR_DEPARTMENT_OTHER): Payer: Self-pay

## 2020-12-10 ENCOUNTER — Emergency Department (HOSPITAL_BASED_OUTPATIENT_CLINIC_OR_DEPARTMENT_OTHER): Payer: BC Managed Care – PPO

## 2020-12-10 ENCOUNTER — Other Ambulatory Visit: Payer: Self-pay

## 2020-12-10 DIAGNOSIS — Z79899 Other long term (current) drug therapy: Secondary | ICD-10-CM | POA: Insufficient documentation

## 2020-12-10 DIAGNOSIS — Z87891 Personal history of nicotine dependence: Secondary | ICD-10-CM | POA: Insufficient documentation

## 2020-12-10 DIAGNOSIS — Z7984 Long term (current) use of oral hypoglycemic drugs: Secondary | ICD-10-CM | POA: Insufficient documentation

## 2020-12-10 DIAGNOSIS — R197 Diarrhea, unspecified: Secondary | ICD-10-CM | POA: Insufficient documentation

## 2020-12-10 DIAGNOSIS — R569 Unspecified convulsions: Secondary | ICD-10-CM

## 2020-12-10 DIAGNOSIS — R1110 Vomiting, unspecified: Secondary | ICD-10-CM | POA: Insufficient documentation

## 2020-12-10 DIAGNOSIS — R5383 Other fatigue: Secondary | ICD-10-CM | POA: Insufficient documentation

## 2020-12-10 DIAGNOSIS — M542 Cervicalgia: Secondary | ICD-10-CM | POA: Insufficient documentation

## 2020-12-10 DIAGNOSIS — E119 Type 2 diabetes mellitus without complications: Secondary | ICD-10-CM | POA: Diagnosis not present

## 2020-12-10 DIAGNOSIS — G40909 Epilepsy, unspecified, not intractable, without status epilepticus: Secondary | ICD-10-CM | POA: Insufficient documentation

## 2020-12-10 DIAGNOSIS — R519 Headache, unspecified: Secondary | ICD-10-CM | POA: Insufficient documentation

## 2020-12-10 LAB — COMPREHENSIVE METABOLIC PANEL
ALT: 19 U/L (ref 0–44)
AST: 17 U/L (ref 15–41)
Albumin: 3.4 g/dL — ABNORMAL LOW (ref 3.5–5.0)
Alkaline Phosphatase: 59 U/L (ref 38–126)
Anion gap: 7 (ref 5–15)
BUN: 11 mg/dL (ref 6–20)
CO2: 27 mmol/L (ref 22–32)
Calcium: 8.9 mg/dL (ref 8.9–10.3)
Chloride: 104 mmol/L (ref 98–111)
Creatinine, Ser: 0.56 mg/dL (ref 0.44–1.00)
GFR, Estimated: >60 mL/min (ref >60)
Glucose, Bld: 90 mg/dL (ref 70–99)
Potassium: 3.7 mmol/L (ref 3.5–5.1)
Sodium: 138 mmol/L (ref 135–145)
Total Bilirubin: 0.3 mg/dL (ref 0.3–1.2)
Total Protein: 6.8 g/dL (ref 6.5–8.1)

## 2020-12-10 LAB — CBC WITH DIFFERENTIAL/PLATELET
Abs Immature Granulocytes: 0.04 10*3/uL (ref 0.00–0.07)
Basophils Absolute: 0 10*3/uL (ref 0.0–0.1)
Basophils Relative: 0 %
Eosinophils Absolute: 0.2 10*3/uL (ref 0.0–0.5)
Eosinophils Relative: 2 %
HCT: 34.7 % — ABNORMAL LOW (ref 36.0–46.0)
Hemoglobin: 10.7 g/dL — ABNORMAL LOW (ref 12.0–15.0)
Immature Granulocytes: 1 %
Lymphocytes Relative: 42 %
Lymphs Abs: 3.4 10*3/uL (ref 0.7–4.0)
MCH: 24.5 pg — ABNORMAL LOW (ref 26.0–34.0)
MCHC: 30.8 g/dL (ref 30.0–36.0)
MCV: 79.6 fL — ABNORMAL LOW (ref 80.0–100.0)
Monocytes Absolute: 0.6 10*3/uL (ref 0.1–1.0)
Monocytes Relative: 8 %
Neutro Abs: 3.9 10*3/uL (ref 1.7–7.7)
Neutrophils Relative %: 47 %
Platelets: 276 10*3/uL (ref 150–400)
RBC: 4.36 MIL/uL (ref 3.87–5.11)
RDW: 17.4 % — ABNORMAL HIGH (ref 11.5–15.5)
WBC: 8.2 10*3/uL (ref 4.0–10.5)
nRBC: 0 % (ref 0.0–0.2)

## 2020-12-10 LAB — RAPID URINE DRUG SCREEN, HOSP PERFORMED
Amphetamines: NOT DETECTED
Barbiturates: NOT DETECTED
Benzodiazepines: NOT DETECTED
Cocaine: NOT DETECTED
Opiates: NOT DETECTED
Tetrahydrocannabinol: POSITIVE — AB

## 2020-12-10 LAB — CBG MONITORING, ED: Glucose-Capillary: 84 mg/dL (ref 70–99)

## 2020-12-10 LAB — PREGNANCY, URINE: Preg Test, Ur: NEGATIVE

## 2020-12-10 MED ORDER — ONDANSETRON HCL 4 MG/2ML IJ SOLN
4.0000 mg | Freq: Once | INTRAMUSCULAR | Status: AC
  Administered 2020-12-10: 4 mg via INTRAVENOUS
  Filled 2020-12-10: qty 2

## 2020-12-10 MED ORDER — LEVETIRACETAM IN NACL 500 MG/100ML IV SOLN
500.0000 mg | Freq: Once | INTRAVENOUS | Status: AC
  Administered 2020-12-10: 500 mg via INTRAVENOUS
  Filled 2020-12-10: qty 100

## 2020-12-10 MED ORDER — LEVETIRACETAM IN NACL 1000 MG/100ML IV SOLN
1000.0000 mg | Freq: Once | INTRAVENOUS | Status: AC
  Administered 2020-12-10: 1000 mg via INTRAVENOUS
  Filled 2020-12-10: qty 100

## 2020-12-10 MED ORDER — LEVETIRACETAM IN NACL 1500 MG/100ML IV SOLN: 1500.0000 mg | Freq: Once | INTRAVENOUS | Status: DC

## 2020-12-10 MED ORDER — SODIUM CHLORIDE 0.9 % IV BOLUS
1000.0000 mL | Freq: Once | INTRAVENOUS | Status: AC
  Administered 2020-12-10: 1000 mL via INTRAVENOUS

## 2020-12-10 MED ORDER — FENTANYL CITRATE PF 50 MCG/ML IJ SOSY
50.0000 ug | PREFILLED_SYRINGE | Freq: Once | INTRAMUSCULAR | Status: AC
  Administered 2020-12-10: 50 ug via INTRAVENOUS
  Filled 2020-12-10: qty 1

## 2020-12-10 MED ORDER — ACETAMINOPHEN 500 MG PO TABS
1000.0000 mg | ORAL_TABLET | Freq: Once | ORAL | Status: AC
  Administered 2020-12-10: 1000 mg via ORAL
  Filled 2020-12-10: qty 2

## 2020-12-10 NOTE — ED Triage Notes (Addendum)
First contact with patient. Patient arrived via BLS from work with complaints of witnessed seizure x 3 today. Patient does not remember incident. Per witnesses she did hit her head - no reported injuries noted to scalp. Patient is A&OX 4 - at this time. Respirations even/unlabored. Patient changed into gown and placed on monitor and call light within reach. Seizure pads placed on gurney for safety. Patient updated on plan of care. Will continue to monitor patient.   1605: Patient currently in CT at this time. Family at bedside.

## 2020-12-10 NOTE — Discharge Instructions (Signed)
Stop taking the metformin.  Follow-up with your primary care doctor about alternative medicines to attempt for the diabetes. Continue taking 1000 mg of Keppra twice daily.  Follow-up with your neurologist next week as planned. Drink plenty of fluids, try to avoid eating large amounts of carbohydrates or sugar.
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
## 2020-12-10 NOTE — ED Notes (Signed)
No acute distress noted upon this RN's departure of patient. Verified discharge paperwork with name and DOB. Vital signs stable. Patient taken to checkout window. Ambulated without incident. Discharge paperwork discussed with triage. No further questions voiced upon discharge.

## 2020-12-12 ENCOUNTER — Encounter (HOSPITAL_COMMUNITY): Payer: Self-pay

## 2020-12-12 ENCOUNTER — Other Ambulatory Visit: Payer: Self-pay

## 2020-12-12 ENCOUNTER — Observation Stay (HOSPITAL_COMMUNITY)
Admission: EM | Admit: 2020-12-12 | Discharge: 2020-12-12 | Discharge status: Against Medical Advice | Payer: BC Managed Care – PPO | Attending: Internal Medicine | Admitting: Internal Medicine

## 2020-12-12 DIAGNOSIS — Z885 Allergy status to narcotic agent status: Secondary | ICD-10-CM | POA: Diagnosis not present

## 2020-12-12 DIAGNOSIS — Z87891 Personal history of nicotine dependence: Secondary | ICD-10-CM | POA: Diagnosis not present

## 2020-12-12 DIAGNOSIS — Z79899 Other long term (current) drug therapy: Secondary | ICD-10-CM | POA: Insufficient documentation

## 2020-12-12 DIAGNOSIS — E119 Type 2 diabetes mellitus without complications: Secondary | ICD-10-CM | POA: Diagnosis present

## 2020-12-12 DIAGNOSIS — Z5329 Procedure and treatment not carried out because of patient's decision for other reasons: Secondary | ICD-10-CM | POA: Diagnosis not present

## 2020-12-12 DIAGNOSIS — R519 Headache, unspecified: Secondary | ICD-10-CM | POA: Insufficient documentation

## 2020-12-12 DIAGNOSIS — Z888 Allergy status to other drugs, medicaments and biological substances status: Secondary | ICD-10-CM | POA: Diagnosis not present

## 2020-12-12 DIAGNOSIS — R569 Unspecified convulsions: Secondary | ICD-10-CM | POA: Diagnosis present

## 2020-12-12 DIAGNOSIS — Z91018 Allergy to other foods: Secondary | ICD-10-CM | POA: Diagnosis not present

## 2020-12-12 LAB — CBC WITH DIFFERENTIAL/PLATELET
Abs Immature Granulocytes: 0.03 10*3/uL (ref 0.00–0.07)
Basophils Absolute: 0 10*3/uL (ref 0.0–0.1)
Basophils Relative: 1 %
Eosinophils Absolute: 0.2 10*3/uL (ref 0.0–0.5)
Eosinophils Relative: 3 %
HCT: 34.1 % — ABNORMAL LOW (ref 36.0–46.0)
Hemoglobin: 10.2 g/dL — ABNORMAL LOW (ref 12.0–15.0)
Immature Granulocytes: 1 %
Lymphocytes Relative: 40 %
Lymphs Abs: 2.5 10*3/uL (ref 0.7–4.0)
MCH: 24.2 pg — ABNORMAL LOW (ref 26.0–34.0)
MCHC: 29.9 g/dL — ABNORMAL LOW (ref 30.0–36.0)
MCV: 80.8 fL (ref 80.0–100.0)
Monocytes Absolute: 0.6 10*3/uL (ref 0.1–1.0)
Monocytes Relative: 9 %
Neutro Abs: 3 10*3/uL (ref 1.7–7.7)
Neutrophils Relative %: 46 %
Platelets: 260 10*3/uL (ref 150–400)
RBC: 4.22 MIL/uL (ref 3.87–5.11)
RDW: 17.2 % — ABNORMAL HIGH (ref 11.5–15.5)
WBC: 6.3 10*3/uL (ref 4.0–10.5)
nRBC: 0 % (ref 0.0–0.2)

## 2020-12-12 LAB — COMPREHENSIVE METABOLIC PANEL
ALT: 19 U/L (ref 0–44)
AST: 20 U/L (ref 15–41)
Albumin: 3.4 g/dL — ABNORMAL LOW (ref 3.5–5.0)
Alkaline Phosphatase: 56 U/L (ref 38–126)
Anion gap: 4 — ABNORMAL LOW (ref 5–15)
BUN: 11 mg/dL (ref 6–20)
CO2: 26 mmol/L (ref 22–32)
Calcium: 8.1 mg/dL — ABNORMAL LOW (ref 8.9–10.3)
Chloride: 109 mmol/L (ref 98–111)
Creatinine, Ser: 0.59 mg/dL (ref 0.44–1.00)
GFR, Estimated: >60 mL/min (ref >60)
Glucose, Bld: 90 mg/dL (ref 70–99)
Potassium: 3.6 mmol/L (ref 3.5–5.1)
Sodium: 139 mmol/L (ref 135–145)
Total Bilirubin: 0.5 mg/dL (ref 0.3–1.2)
Total Protein: 6.6 g/dL (ref 6.5–8.1)

## 2020-12-12 LAB — I-STAT BETA HCG BLOOD, ED (MC, WL, AP ONLY): I-stat hCG, quantitative: <5 m[IU]/mL (ref <5)

## 2020-12-12 LAB — MAGNESIUM: Magnesium: 1.6 mg/dL — ABNORMAL LOW (ref 1.7–2.4)

## 2020-12-12 MED ORDER — LEVETIRACETAM IN NACL 1000 MG/100ML IV SOLN
1000.0000 mg | Freq: Once | INTRAVENOUS | Status: AC
  Administered 2020-12-12: 1000 mg via INTRAVENOUS
  Filled 2020-12-12: qty 100

## 2020-12-12 MED ORDER — MAGNESIUM SULFATE 2 GM/50ML IV SOLN
2.0000 g | Freq: Once | INTRAVENOUS | Status: AC
  Administered 2020-12-12: 2 g via INTRAVENOUS
  Filled 2020-12-12: qty 50

## 2020-12-12 MED ORDER — DIPHENHYDRAMINE HCL 50 MG/ML IJ SOLN
25.0000 mg | Freq: Once | INTRAMUSCULAR | Status: AC
  Administered 2020-12-12: 25 mg via INTRAVENOUS
  Filled 2020-12-12: qty 1

## 2020-12-12 MED ORDER — METOCLOPRAMIDE HCL 5 MG/ML IJ SOLN
10.0000 mg | Freq: Once | INTRAMUSCULAR | Status: AC
  Administered 2020-12-12: 10 mg via INTRAVENOUS
  Filled 2020-12-12: qty 2

## 2020-12-12 MED ORDER — ACETAMINOPHEN 500 MG PO TABS
1000.0000 mg | ORAL_TABLET | Freq: Once | ORAL | Status: AC
  Administered 2020-12-12: 1000 mg via ORAL
  Filled 2020-12-12: qty 2
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
## 2020-12-12 NOTE — H&P (Signed)
AMA NOTIFICATION CONSULT NOTE  While interviewing patient, she notified me that she wanted to leave. I asked why and she could not give a specific reason other than she did not want to be here. She asked to have her IV removed. She said she would get her PCP to refer her to a neurologist. I informed her that she would be leaving against medical advice. The recommendation is for her to be evaluated w/ our neurology team. She stated that she still wanted to leave. I informed her that seizures are a dangerous situation and can result in her death without proper treatment. She stated that she knew that and still wanted to leave. I again stated that seizures are a dangerous situation and that leaving without proper eval and treatment can result in serious harm. I told her that if she were to leave AMA she would need to sign paperwork accepting the consequences of these actions. She stated she would do that. AMA paperwork was provided. The patient left AMA. EDP was notified by me personally.   Unable to obtain PMHx/PSHx Unable to obtain ROS Unable to obtain Physical exam  Assessment Seizure: Patient is leaving [place], DO

## 2020-12-12 NOTE — ED Triage Notes (Signed)
Per EMS hx of seizures. Pt called EMS due to feeling like she was going to have a seizure.   Pt had one seizure today. Pt has had increased seizures for the last 2 week. Dx with DM in November and seizure have been worse.  Dark red urine/stool since stopping metformin.  [facility] ago.   CBG 101 BP 154/76 HR 72 Spo2 96%  20LAC IV

## 2020-12-13 ENCOUNTER — Emergency Department (HOSPITAL_COMMUNITY): Payer: BC Managed Care – PPO

## 2020-12-13 ENCOUNTER — Observation Stay (HOSPITAL_COMMUNITY)
Admission: EM | Admit: 2020-12-13 | Discharge: 2020-12-14 | Disposition: A | Discharge status: Home / Self Care | Payer: BC Managed Care – PPO | Attending: Emergency Medicine | Admitting: Emergency Medicine

## 2020-12-13 ENCOUNTER — Encounter (HOSPITAL_COMMUNITY): Payer: Self-pay

## 2020-12-13 ENCOUNTER — Other Ambulatory Visit: Payer: Self-pay

## 2020-12-13 ENCOUNTER — Observation Stay (HOSPITAL_COMMUNITY): Payer: BC Managed Care – PPO

## 2020-12-13 DIAGNOSIS — G40919 Epilepsy, unspecified, intractable, without status epilepticus: Secondary | ICD-10-CM | POA: Diagnosis not present

## 2020-12-13 DIAGNOSIS — I10 Essential (primary) hypertension: Secondary | ICD-10-CM | POA: Diagnosis present

## 2020-12-13 DIAGNOSIS — F1210 Cannabis abuse, uncomplicated: Secondary | ICD-10-CM | POA: Diagnosis present

## 2020-12-13 DIAGNOSIS — Z79899 Other long term (current) drug therapy: Secondary | ICD-10-CM | POA: Diagnosis not present

## 2020-12-13 DIAGNOSIS — Z20822 Contact with and (suspected) exposure to covid-19: Secondary | ICD-10-CM | POA: Diagnosis not present

## 2020-12-13 DIAGNOSIS — E1169 Type 2 diabetes mellitus with other specified complication: Secondary | ICD-10-CM | POA: Diagnosis present

## 2020-12-13 DIAGNOSIS — F445 Conversion disorder with seizures or convulsions: Secondary | ICD-10-CM

## 2020-12-13 DIAGNOSIS — R569 Unspecified convulsions: Secondary | ICD-10-CM

## 2020-12-13 DIAGNOSIS — Z87891 Personal history of nicotine dependence: Secondary | ICD-10-CM | POA: Diagnosis not present

## 2020-12-13 DIAGNOSIS — E119 Type 2 diabetes mellitus without complications: Secondary | ICD-10-CM | POA: Diagnosis not present

## 2020-12-13 HISTORY — DX: Type 2 diabetes mellitus with other specified complication: E11.69

## 2020-12-13 HISTORY — DX: Morbid (severe) obesity due to excess calories: E66.01

## 2020-12-13 HISTORY — DX: Obstructive sleep apnea (adult) (pediatric): G47.33

## 2020-12-13 HISTORY — DX: Cannabis abuse, uncomplicated: F12.10

## 2020-12-13 HISTORY — DX: Type 2 diabetes mellitus with other specified complication: E66.9

## 2020-12-13 LAB — BASIC METABOLIC PANEL
Anion gap: 6 (ref 5–15)
BUN: 11 mg/dL (ref 6–20)
CO2: 28 mmol/L (ref 22–32)
Calcium: 9.1 mg/dL (ref 8.9–10.3)
Chloride: 104 mmol/L (ref 98–111)
Creatinine, Ser: 0.63 mg/dL (ref 0.44–1.00)
GFR, Estimated: >60 mL/min (ref >60)
Glucose, Bld: 107 mg/dL — ABNORMAL HIGH (ref 70–99)
Potassium: 3.8 mmol/L (ref 3.5–5.1)
Sodium: 138 mmol/L (ref 135–145)

## 2020-12-13 LAB — RAPID URINE DRUG SCREEN, HOSP PERFORMED
Amphetamines: NOT DETECTED
Barbiturates: NOT DETECTED
Benzodiazepines: NOT DETECTED
Cocaine: NOT DETECTED
Opiates: NOT DETECTED
Tetrahydrocannabinol: POSITIVE — AB

## 2020-12-13 LAB — CBG MONITORING, ED
Glucose-Capillary: 75 mg/dL (ref 70–99)
Glucose-Capillary: 94 mg/dL (ref 70–99)

## 2020-12-13 LAB — CBC WITH DIFFERENTIAL/PLATELET
Abs Immature Granulocytes: 0.04 10*3/uL (ref 0.00–0.07)
Basophils Absolute: 0 10*3/uL (ref 0.0–0.1)
Basophils Relative: 1 %
Eosinophils Absolute: 0.2 10*3/uL (ref 0.0–0.5)
Eosinophils Relative: 2 %
HCT: 37.2 % (ref 36.0–46.0)
Hemoglobin: 11.2 g/dL — ABNORMAL LOW (ref 12.0–15.0)
Immature Granulocytes: 1 %
Lymphocytes Relative: 44 %
Lymphs Abs: 2.8 10*3/uL (ref 0.7–4.0)
MCH: 24.2 pg — ABNORMAL LOW (ref 26.0–34.0)
MCHC: 30.1 g/dL (ref 30.0–36.0)
MCV: 80.5 fL (ref 80.0–100.0)
Monocytes Absolute: 0.4 10*3/uL (ref 0.1–1.0)
Monocytes Relative: 7 %
Neutro Abs: 2.9 10*3/uL (ref 1.7–7.7)
Neutrophils Relative %: 45 %
Platelets: 306 10*3/uL (ref 150–400)
RBC: 4.62 MIL/uL (ref 3.87–5.11)
RDW: 17.3 % — ABNORMAL HIGH (ref 11.5–15.5)
WBC: 6.4 10*3/uL (ref 4.0–10.5)
nRBC: 0 % (ref 0.0–0.2)

## 2020-12-13 LAB — RESP PANEL BY RT-PCR (FLU A&B, COVID) ARPGX2
Influenza A by PCR: NEGATIVE
Influenza B by PCR: NEGATIVE
SARS Coronavirus 2 by RT PCR: NEGATIVE

## 2020-12-13 LAB — I-STAT BETA HCG BLOOD, ED (MC, WL, AP ONLY): I-stat hCG, quantitative: <5 m[IU]/mL (ref <5)

## 2020-12-13 LAB — HEMOGLOBIN A1C
Hgb A1c MFr Bld: 6.1 % — ABNORMAL HIGH (ref 4.8–5.6)
Mean Plasma Glucose: 128.37 mg/dL

## 2020-12-13 LAB — MAGNESIUM: Magnesium: 1.9 mg/dL (ref 1.7–2.4)

## 2020-12-13 LAB — LEVETIRACETAM LEVEL: Levetiracetam Lvl: <2 ug/mL — ABNORMAL LOW (ref 10.0–40.0)

## 2020-12-13 LAB — HIV ANTIBODY (ROUTINE TESTING W REFLEX): HIV Screen 4th Generation wRfx: NONREACTIVE

## 2020-12-13 MED ORDER — LORAZEPAM 2 MG/ML IJ SOLN: 4.0000 mg | INTRAMUSCULAR | Status: DC | PRN

## 2020-12-13 MED ORDER — GADOBUTROL 1 MMOL/ML IV SOLN
10.0000 mL | Freq: Once | INTRAVENOUS | Status: AC | PRN
  Administered 2020-12-13: 10 mL via INTRAVENOUS

## 2020-12-13 MED ORDER — LORAZEPAM 2 MG/ML IJ SOLN
1.0000 mg | Freq: Once | INTRAMUSCULAR | Status: AC
  Administered 2020-12-13: 1 mg via INTRAVENOUS

## 2020-12-13 MED ORDER — ACETAMINOPHEN 500 MG PO TABS
1000.0000 mg | ORAL_TABLET | Freq: Once | ORAL | Status: AC
  Administered 2020-12-13: 1000 mg via ORAL
  Filled 2020-12-13: qty 2

## 2020-12-13 MED ORDER — SODIUM CHLORIDE 0.9 % IV SOLN
75.0000 mL/h | INTRAVENOUS | Status: DC
  Administered 2020-12-13: 75 mL/h via INTRAVENOUS

## 2020-12-13 MED ORDER — MAGNESIUM GLUCONATE 500 MG PO TABS
500.0000 mg | ORAL_TABLET | Freq: Once | ORAL | Status: AC
  Administered 2020-12-13: 500 mg via ORAL
  Filled 2020-12-13: qty 1

## 2020-12-13 MED ORDER — DOCUSATE SODIUM 100 MG PO CAPS
100.0000 mg | ORAL_CAPSULE | Freq: Two times a day (BID) | ORAL | Status: DC
  Administered 2020-12-13: 100 mg via ORAL
  Filled 2020-12-13 (×2): qty 1

## 2020-12-13 MED ORDER — LEVETIRACETAM IN NACL 1000 MG/100ML IV SOLN
1000.0000 mg | Freq: Once | INTRAVENOUS | Status: AC
Start: 2020-12-13 — End: 2020-12-13
  Administered 2020-12-13: 1000 mg via INTRAVENOUS
  Filled 2020-12-13: qty 100

## 2020-12-13 MED ORDER — ONDANSETRON HCL 4 MG/2ML IJ SOLN: 4.0000 mg | Freq: Four times a day (QID) | INTRAMUSCULAR | Status: DC | PRN

## 2020-12-13 MED ORDER — ONDANSETRON HCL 4 MG PO TABS: 4.0000 mg | ORAL_TABLET | Freq: Four times a day (QID) | ORAL | Status: DC | PRN

## 2020-12-13 MED ORDER — ENOXAPARIN SODIUM 40 MG/0.4ML IJ SOSY: 40.0000 mg | PREFILLED_SYRINGE | INTRAMUSCULAR | Status: DC

## 2020-12-13 MED ORDER — INSULIN ASPART 100 UNIT/ML IJ SOLN: 0.0000 [IU] | Freq: Three times a day (TID) | INTRAMUSCULAR | Status: DC

## 2020-12-13 MED ORDER — ACETAMINOPHEN 650 MG RE SUPP: 650.0000 mg | RECTAL | Status: DC | PRN

## 2020-12-13 MED ORDER — OXYCODONE HCL 5 MG PO TABS
5.0000 mg | ORAL_TABLET | Freq: Four times a day (QID) | ORAL | Status: DC | PRN
  Administered 2020-12-13 – 2020-12-14 (×3): 5 mg via ORAL
  Filled 2020-12-13 (×3): qty 1

## 2020-12-13 MED ORDER — LEVETIRACETAM IN NACL 1000 MG/100ML IV SOLN
1000.0000 mg | Freq: Once | INTRAVENOUS | Status: AC
  Administered 2020-12-13: 1000 mg via INTRAVENOUS
  Filled 2020-12-13: qty 100

## 2020-12-13 MED ORDER — POLYETHYLENE GLYCOL 3350 17 G PO PACK: 17.0000 g | PACK | Freq: Every day | ORAL | Status: DC | PRN

## 2020-12-13 MED ORDER — ACETAMINOPHEN 325 MG PO TABS
650.0000 mg | ORAL_TABLET | ORAL | Status: DC | PRN
  Administered 2020-12-13 – 2020-12-14 (×2): 650 mg via ORAL
  Filled 2020-12-13 (×2): qty 2

## 2020-12-13 MED ORDER — MELATONIN 3 MG PO TABS
3.0000 mg | ORAL_TABLET | Freq: Once | ORAL | Status: AC
  Administered 2020-12-13: 3 mg via ORAL
  Filled 2020-12-13: qty 1

## 2020-12-13 MED ORDER — HYDROCHLOROTHIAZIDE 12.5 MG PO TABS
12.5000 mg | ORAL_TABLET | Freq: Every day | ORAL | Status: DC
  Administered 2020-12-13 – 2020-12-14 (×2): 12.5 mg via ORAL
  Filled 2020-12-13 (×2): qty 1

## 2020-12-13 MED ORDER — ALBUTEROL SULFATE (2.5 MG/3ML) 0.083% IN NEBU: 3.0000 mL | INHALATION_SOLUTION | RESPIRATORY_TRACT | Status: DC | PRN

## 2020-12-13 MED ORDER — ENOXAPARIN SODIUM 80 MG/0.8ML IJ SOSY
70.0000 mg | PREFILLED_SYRINGE | INTRAMUSCULAR | Status: DC
  Administered 2020-12-13: 70 mg via SUBCUTANEOUS
  Filled 2020-12-13 (×2): qty 0.7

## 2020-12-13 MED ORDER — LORAZEPAM 2 MG/ML IJ SOLN
INTRAMUSCULAR | Status: AC
  Filled 2020-12-13: qty 1

## 2020-12-13 MED ORDER — SODIUM CHLORIDE 0.9 % IV SOLN: 4000.0000 mg | Freq: Once | INTRAVENOUS | Status: DC

## 2020-12-13 MED ORDER — LEVETIRACETAM 500 MG PO TABS
1000.0000 mg | ORAL_TABLET | Freq: Two times a day (BID) | ORAL | Status: DC
  Administered 2020-12-13 – 2020-12-14 (×2): 1000 mg via ORAL
  Filled 2020-12-13 (×2): qty 2
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
## 2020-12-13 NOTE — ED Provider Notes (Signed)
Emergency Medicine Provider Triage Evaluation Note  Alicia Becker , a 30 y.o. female  was evaluated in triage.  Pt complains of seizure. Has had numerous seizures over the past few days. Seen at [facility] yesterday where she was recommended for admission; however left AMA. She admits to 2 seizures today. One in the shower where she bit her tongue. The other was witnessed by her neighbor associated with urinary incontinence. She is currently on Keppra which she has been compliant with. She was recently prescribed Metformin however stopped a few days due to frequent seizures. Denies alcohol use.   Review of Systems  Positive: seizure Negative: fever  Physical Exam  BP (!) 154/99   Pulse 91   Temp 98.4 F (36.9 C) (Oral)   Resp 17   Ht 5\' 3"  (1.6 m)   Wt (!) 145.2 kg   LMP 11/20/2020   SpO2 98%   BMI 56.69 kg/m  Gen:   Awake, no distress   Resp:  Normal effort  MSK:   Moves extremities without difficulty  Other:  Normal speech. AAOx4. Cranial nerves grossly intact  Medical Decision Making  Medically screening exam initiated at 11:37 AM.  Appropriate orders placed.  Alicia Becker was informed that the remainder of the evaluation will be completed by another provider, this initial triage assessment does not replace that evaluation, and the importance of remaining in the ED until their evaluation is complete.  Seizures. Labs ordered CT head and cervical spine due to trauma   Suzy Bouchard, PA-C 12/13/20 1139    Milton Ferguson, MD 12/13/20 [phone]

## 2020-12-13 NOTE — ED Notes (Signed)
Pt placed in hospital bed for comfort, pt also requesting something for sleep. MD Hal Hope notified.
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
## 2020-12-13 NOTE — Progress Notes (Signed)
EEG complete - results pending 

## 2020-12-13 NOTE — ED Notes (Signed)
Pt returned from MRI without scan being completed. Per MRI tech, pt was unable to tolerate the scan despite receiving PRN IV ativan. MD notified.

## 2020-12-13 NOTE — ED Notes (Signed)
Patient transported to MRI 

## 2020-12-13 NOTE — Procedures (Signed)
Patient Name: [name]  MRN: [id number]  Epilepsy Attending: Lora Havens  Referring Physician/Provider: Karie Kirks Date: 12/13/2020 Duration: 25.43 mins  Patient history: 30 year old female with reported history of seizures prescribed Keppra by her outpatient PCP who presented to the ED for 2 weeks of reported frequent seizures at home. EEG to evaluate for seizure  Level of alertness: Awake  AEDs during EEG study: LEV  Technical aspects: This EEG study was done with scalp electrodes positioned according to the 10-20 International system of electrode placement. Electrical activity was acquired at a sampling rate of 500Hz  and reviewed with a high frequency filter of 70Hz  and a low frequency filter of 1Hz . EEG data were recorded continuously and digitally stored.   Description: The posterior dominant rhythm consists of 10 Hz activity of moderate voltage (25-35 uV) seen predominantly in posterior head regions, symmetric and reactive to eye opening and eye closing.   One event was recorded  on 12/13/2020 at 1739. During the event patient was initially not responding to her name and then had non rhythmic whole body tremor like movements. Concomitant EEG before, during and after the event did not show any EEG changes suggest seizure. Event lasted for about 40-45 seconds  One event was recorded  on 12/13/2020 at 1749. During the event patient was undergoing photic stimulation, and intermittently made choking and crying sounds, was not responding to her name  Concomitant EEG before, during and after the event showed normal posterior dominant rhythm.   Physiologic photic driving was not seen during photic stimulation.  Hyperventilation was not performed.     IMPRESSION: This study is within normal limits. No seizures or epileptiform discharges were seen throughout the recording.  Two events were recorded during this study as described above without concomitant eeg change and  were NON-epileptic events.   Dr Rory Percy was notified.   Vonya Ohalloran Barbra Sarks

## 2020-12-13 NOTE — ED Notes (Signed)
Chart note: late entry. Neuro assessment charted at 1640pm was completed at approximately 1520pm immediately prior to administration of PRN ativan.

## 2020-12-13 NOTE — ED Triage Notes (Signed)
Pt states she was at [facility] yesterday for evaluation of seizures and left AMA. Pt reports two more seizures lasting approximately 3-4 minutes this morning. Pt on Keppra 1000 mg BID. Pt A+Ox4 during triage.

## 2020-12-13 NOTE — Discharge Summary (Signed)
AMA NOTIFICATION CONSULT NOTE   While interviewing patient, she notified me that she wanted to leave. I asked why and she could not give a specific reason other than she did not want to be here. She asked to have her IV removed. She said she would get her PCP to refer her to a neurologist. I informed her that she would be leaving against medical advice. The recommendation is for her to be evaluated w/ our neurology team. She stated that she still wanted to leave. I informed her that seizures are a dangerous situation and can result in her death without proper treatment. She stated that she knew that and still wanted to leave. I again stated that seizures are a dangerous situation and that leaving without proper eval and treatment can result in serious harm. I told her that if she were to leave AMA she would need to sign paperwork accepting the consequences of these actions. She stated she would Alicia Becker that. AMA paperwork was provided. The patient left AMA. EDP was notified by me personally.    Unable to obtain PMHx/PSHx Unable to obtain ROS Unable to obtain Physical exam   Assessment Seizure: Patient is leaving [place], Alicia Becker

## 2020-12-13 NOTE — ED Notes (Signed)
Pt ambulated to restroom with minimal assistance.

## 2020-12-13 NOTE — ED Notes (Signed)
EEG tech at bedside. 

## 2020-12-13 NOTE — ED Notes (Signed)
Family called out to nurses station and reported to secretary that pt was having a seizure. Upon another RN entry to room, pt was A&O and shaking her head. Family reported to RN that pt was shaking and in and out of consciousness. VSS WNL throughout reported seizure-like activity. Pt in NAD. EDP was notified.

## 2020-12-14 DIAGNOSIS — G40919 Epilepsy, unspecified, intractable, without status epilepticus: Secondary | ICD-10-CM | POA: Diagnosis not present

## 2020-12-14 LAB — COMPREHENSIVE METABOLIC PANEL
ALT: 22 U/L (ref 0–44)
AST: 19 U/L (ref 15–41)
Albumin: 3.1 g/dL — ABNORMAL LOW (ref 3.5–5.0)
Alkaline Phosphatase: 61 U/L (ref 38–126)
Anion gap: 6 (ref 5–15)
BUN: 9 mg/dL (ref 6–20)
CO2: 28 mmol/L (ref 22–32)
Calcium: 9.2 mg/dL (ref 8.9–10.3)
Chloride: 101 mmol/L (ref 98–111)
Creatinine, Ser: 0.73 mg/dL (ref 0.44–1.00)
GFR, Estimated: >60 mL/min (ref >60)
Glucose, Bld: 94 mg/dL (ref 70–99)
Potassium: 3.5 mmol/L (ref 3.5–5.1)
Sodium: 135 mmol/L (ref 135–145)
Total Bilirubin: 0.4 mg/dL (ref 0.3–1.2)
Total Protein: 6.4 g/dL — ABNORMAL LOW (ref 6.5–8.1)

## 2020-12-14 LAB — CBC
HCT: 36 % (ref 36.0–46.0)
Hemoglobin: 10.8 g/dL — ABNORMAL LOW (ref 12.0–15.0)
MCH: 24.1 pg — ABNORMAL LOW (ref 26.0–34.0)
MCHC: 30 g/dL (ref 30.0–36.0)
MCV: 80.4 fL (ref 80.0–100.0)
Platelets: 261 10*3/uL (ref 150–400)
RBC: 4.48 MIL/uL (ref 3.87–5.11)
RDW: 17.4 % — ABNORMAL HIGH (ref 11.5–15.5)
WBC: 6.9 10*3/uL (ref 4.0–10.5)
nRBC: 0 % (ref 0.0–0.2)

## 2020-12-14 LAB — CBG MONITORING, ED: Glucose-Capillary: 100 mg/dL — ABNORMAL HIGH (ref 70–99)

## 2020-12-14 LAB — LEVETIRACETAM LEVEL: Levetiracetam Lvl: <2 ug/mL — ABNORMAL LOW (ref 10.0–40.0)

## 2020-12-14 MED ORDER — AMLODIPINE BESYLATE 5 MG PO TABS
5.0000 mg | ORAL_TABLET | Freq: Every day | ORAL | Status: DC
  Administered 2020-12-14: 5 mg via ORAL
  Filled 2020-12-14: qty 1

## 2020-12-14 MED ORDER — AMLODIPINE BESYLATE 5 MG PO TABS
5.0000 mg | ORAL_TABLET | Freq: Every day | ORAL | 0 refills | Status: AC
Start: 2020-12-14 — End: ?

## 2020-12-14 NOTE — ED Notes (Signed)
Pt c/o of headache. PRN tylenol given.

## 2020-12-14 NOTE — ED Notes (Signed)
Pt placed on 2L [place] while sleeping d/t sats in upper 80s. Pt states she has sleep apnea.

## 2020-12-14 NOTE — ED Notes (Signed)
BP 158/118. Contacted Dr. Cruzita Lederer for PRNs

## 2020-12-14 NOTE — Discharge Summary (Signed)
Physician Discharge Summary  Alicia Becker SEG:[id number] DOB: August 19, 1990 DOA: 12/13/2020  PCP: System, Provider Not In  Admit date: 12/13/2020 Discharge date: 12/14/2020  Admitted From: home Disposition:  home  Recommendations for Outpatient Follow-up:  Follow up with PCP in 1-2 weeks Follow up with Neurology as an outpatient   Home Health: none Equipment/Devices: none  Discharge Condition: stable CODE STATUS: Full code Diet recommendation: heart healthy  HPI: Per admitting MD, Alicia Becker is a 30 y.o. female with medical history significant of seizure d/o (diagnosed on sleep study, has not seen neurology), recent DM, obesity, OSA not on CPAP presenting with breakthrough seizures.  She reports having back to back seizures.  As of last week, her PCP (NP) said her A1c was high and she should start metformin for DM.  Since then she has bene having recurrent seizures despite Keppra 1000 mg BID.  She has been to the hospital 3 times this week without improvement.  Some of the seizures are "full blown" and others she is "half aware", can feel it coming on.  Shaking, jittery, lip trembling, and she feels like she needs to sit down or drink water.  With "full blown seizures" she is confused and takes a few minutes to recover.  Her body hurts and she feels like she has been running.  She gets a headache afterwards.  Some urinary incontinence with big seizures.  Most recent witnessed ones lasted 3-4 minutes.  Her PCP recently diagnosed DM and started on metformin (not on med list).   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  She has a history of same and has been on Keppra for a long time.  Neurology consulted and followed patient while hospitalized.  She underwent an MRI of the brain which was normal.  An EEG was done, while having the EEG there were 2 events captured but had no concomitant EEG  changes, likely nonepileptic seizures.  Discussed with Dr. Malen Gauze with neurology, she is stable, will be discharged home and she is to continue her prior Keppra dose.  She needs neurology follow-up as an outpatient and ambulatory referral has been done.  Driver and other seizure related restriction have been discussed in detail with the patient  Active problems HTN -Continue HCTZ, persistently hypertensive, add amlodipine on discharge Prediabetes-A1c was 6.1.  She was started on metformin by PCP few weeks ago and patient believes that is the reason for her current issues.  She has decided to stop the metformin.  OSA -Not using CPAP Marijuana abuse -Cessation encouraged; this should be encouraged on an ongoing basis Obesity, class III -Body mass index is 56.69 kg/m.  She would benefit from weight loss  Sepsis ruled out  Discharge Instructions  Discharge Instructions     Ambulatory referral to Neurology   Complete by: As directed    An appointment is requested in approximately: 2 weeks      Allergies as of 12/14/2020       Reactions   Watermelon Flavor Anaphylaxis   Ibuprofen Other (See Comments)   "Makes my stomach lining bleed" per GI physician some time in 2019. Pt reports having taken prescription strength ibuprofen since then without any issues.   Toradol [ketorolac Tromethamine] Itching   Tramadol Rash        Medication List     TAKE these medications    albuterol 108 (90 Base) MCG/ACT inhaler Commonly known as: VENTOLIN  HFA Inhale 2 puffs into the lungs every 4 (four) hours as needed for wheezing or shortness of breath.   amLODipine 5 MG tablet Commonly known as: NORVASC Take 1 tablet (5 mg total) by mouth daily.   diphenhydramine-acetaminophen 25-500 MG Tabs tablet Commonly known as: TYLENOL PM Take 1 tablet by mouth at bedtime as needed (pain/fever).   hydrochlorothiazide 25 MG tablet Commonly known as: HYDRODIURIL Take 12.5 mg by mouth daily.    levETIRAcetam 1000 MG tablet Commonly known as: KEPPRA Take 1,000 mg by mouth 2 (two) times daily.       Consultations: Neurology  Procedures/Studies:  CT Head Wo Contrast  Result Date: 12/13/2020 CLINICAL DATA:  Seizure. EXAM: CT HEAD WITHOUT CONTRAST CT CERVICAL SPINE WITHOUT CONTRAST TECHNIQUE: Multidetector CT imaging of the head and cervical spine was performed following the standard protocol without intravenous contrast. Multiplanar CT image reconstructions of the cervical spine were also generated. COMPARISON:  December 10, 2020. FINDINGS: CT HEAD FINDINGS Brain: No evidence of acute infarction, hemorrhage, hydrocephalus, extra-axial collection or mass lesion/mass effect. Vascular: No hyperdense vessel or unexpected calcification. Skull: Normal. Negative for fracture or focal lesion. Sinuses/Orbits: No acute finding. Other: None. CT CERVICAL SPINE FINDINGS Alignment: Normal. Skull base and vertebrae: No acute fracture. No primary bone lesion or focal pathologic process. Soft tissues and spinal canal: No prevertebral fluid or swelling. No visible canal hematoma. Disc levels:  Normal. Upper chest: Negative. Other: None. IMPRESSION: No acute intracranial abnormality seen. No abnormality seen in the cervical spine. Electronically Signed   By: Marijo Conception M.D.   On: 12/13/2020 13:00   CT Head Wo Contrast  Result Date: 12/10/2020 CLINICAL DATA:  Seizure x3 today.  Head trauma. EXAM: CT HEAD WITHOUT CONTRAST TECHNIQUE: Contiguous axial images were obtained from the base of the skull through the vertex without intravenous contrast. COMPARISON:  08/21/2018 FINDINGS: Brain: No mass lesion, hemorrhage, hydrocephalus, acute infarct, intra-axial, or extra-axial fluid collection. Vascular: No hyperdense vessel or unexpected calcification. Skull: No significant soft tissue swelling.  No skull fracture. Sinuses/Orbits: Normal imaged portions of the orbits and globes. Minimal mucosal thickening of  bilateral maxillary sinuses. Trace left mastoid air cell fluid. Other: None. IMPRESSION: 1.  No acute intracranial abnormality. 2. Mild sinus disease.  Trace left mastoid effusion. Electronically Signed   By: Abigail Miyamoto M.D.   On: 12/10/2020 16:25   CT Cervical Spine Wo Contrast  Result Date: 12/13/2020 CLINICAL DATA:  Seizure. EXAM: CT HEAD WITHOUT CONTRAST CT CERVICAL SPINE WITHOUT CONTRAST TECHNIQUE: Multidetector CT imaging of the head and cervical spine was performed following the standard protocol without intravenous contrast. Multiplanar CT image reconstructions of the cervical spine were also generated. COMPARISON:  December 10, 2020. FINDINGS: CT HEAD FINDINGS Brain: No evidence of acute infarction, hemorrhage, hydrocephalus, extra-axial collection or mass lesion/mass effect. Vascular: No hyperdense vessel or unexpected calcification. Skull: Normal. Negative for fracture or focal lesion. Sinuses/Orbits: No acute finding. Other: None. CT CERVICAL SPINE FINDINGS Alignment: Normal. Skull base and vertebrae: No acute fracture. No primary bone lesion or focal pathologic process. Soft tissues and spinal canal: No prevertebral fluid or swelling. No visible canal hematoma. Disc levels:  Normal. Upper chest: Negative. Other: None. IMPRESSION: No acute intracranial abnormality seen. No abnormality seen in the cervical spine. Electronically Signed   By: Marijo Conception M.D.   On: 12/13/2020 13:00   CT Cervical Spine Wo Contrast  Result Date: 12/10/2020 CLINICAL DATA:  Seizure x3.  Head trauma. EXAM: CT CERVICAL SPINE  WITHOUT CONTRAST TECHNIQUE: Multidetector CT imaging of the cervical spine was performed without intravenous contrast. Multiplanar CT image reconstructions were also generated. COMPARISON:  08/21/2018 FINDINGS: Alignment: Spinal visualization through the bottom of T2. From approximately C5 inferiorly are degraded secondary to patient body habitus. Maintenance of vertebral body height.  Straightening and mild reversal of expected lordosis. Skull base and vertebrae: Trace left mastoid effusion again identified. Skull base intact. Coronal reformats demonstrate a normal C1-C2 articulation. Facets are well-aligned. Soft tissues and spinal canal: Prominent bilateral cervical nodes are likely reactive. No prevertebral soft tissue swelling. Disc levels:  Primarily suboptimally evaluated. Upper chest: No apical pneumothorax. Other: None. IMPRESSION: 1. Degraded evaluation from approximally C5 inferiorly secondary to patient body habitus. 2. Straightening and mild reversal of cervical lordosis could be positional or due to muscular spasm. 3. No fracture or subluxation identified. Electronically Signed   By: Abigail Miyamoto M.D.   On: 12/10/2020 16:30   MR Brain W and Wo Contrast  Result Date: 12/13/2020 CLINICAL DATA:  Seizure, new onset.  No history of trauma. EXAM: MRI HEAD WITHOUT AND WITH CONTRAST TECHNIQUE: Multiplanar, multiecho pulse sequences of the brain and surrounding structures were obtained without and with intravenous contrast. CONTRAST:  17mL GADAVIST GADOBUTROL 1 MMOL/ML IV SOLN COMPARISON:  Head CT December 13, 2020. FINDINGS: Brain: No acute infarction, hemorrhage, hydrocephalus, extra-axial collection or mass lesion. Low lying cerebellar tonsils. The brain parenchyma has normal signal characteristics. No focus of abnormal contrast enhancement. Mesial temporal lobes are symmetric with normal signal characteristics. Vascular: Normal flow voids. Skull and upper cervical spine: Low T1 signal in the visualized spine and clivus, may represent red marrow reconversion in the setting of anemia. Sinuses/Orbits: Negative. Other: Mild bilateral mastoid effusion. IMPRESSION: Unremarkable MRI of the brain. Electronically Signed   By: Pedro Earls M.D.   On: 12/13/2020 16:56   EEG adult  Result Date: 12/13/2020 Lora Havens, MD     12/13/2020  8:43 PM Patient Name: Alicia Becker MRN: [id number] Epilepsy Attending: Lora Havens Referring Physician/Provider: Karie Kirks Date: 12/13/2020 Duration: 25.43 mins Patient history: 30 year old female with reported history of seizures prescribed Keppra by her outpatient PCP who presented to the ED for 2 weeks of reported frequent seizures at home. EEG to evaluate for seizure Level of alertness: Awake AEDs during EEG study: LEV Technical aspects: This EEG study was done with scalp electrodes positioned according to the 10-20 International system of electrode placement. Electrical activity was acquired at a sampling rate of 500Hz  and reviewed with a high frequency filter of 70Hz  and a low frequency filter of 1Hz . EEG data were recorded continuously and digitally stored. Description: The posterior dominant rhythm consists of 10 Hz activity of moderate voltage (25-35 uV) seen predominantly in posterior head regions, symmetric and reactive to eye opening and eye closing. One event was recorded  on 12/13/2020 at 1739. During the event patient was initially not responding to her name and then had non rhythmic whole body tremor like movements. Concomitant EEG before, during and after the event did not show any EEG changes suggest seizure. Event lasted for about 40-45 seconds One event was recorded  on 12/13/2020 at 1749. During the event patient was undergoing photic stimulation, and intermittently made choking and crying sounds, was not responding to her name  Concomitant EEG before, during and after the event showed normal posterior dominant rhythm. Physiologic photic driving was not seen during photic stimulation.  Hyperventilation was not performed.  IMPRESSION: This study is within normal limits. No seizures or epileptiform discharges were seen throughout the recording. Two events were recorded during this study as described above without concomitant eeg change and were NON-epileptic events. Dr Rory Percy was notified. Priyanka Barbra Sarks      Subjective: - no chest pain, shortness of breath, no abdominal pain, nausea or vomiting.   Discharge Exam: BP (!) 148/102    Pulse 75    Temp 97.7 F (36.5 C)    Resp 16    Ht 5\' 3"  (1.6 m)    Wt (!) 145.2 kg    LMP 11/20/2020    SpO2 98%    BMI 56.69 kg/m   General: Pt is alert, awake, not in acute distress Cardiovascular: RRR, S1/S2 +, no rubs, no gallops Respiratory: CTA bilaterally, no wheezing, no rhonchi Abdominal: Soft, NT, ND, bowel sounds + Extremities: no edema, no cyanosis   The results of significant diagnostics from this hospitalization (including imaging, microbiology, ancillary and laboratory) are listed below for reference.     Microbiology: Recent Results (from the past 240 hour(s))  Resp Panel by RT-PCR (Flu A&B, Covid) Nasopharyngeal Swab     Status: None   Collection Time: 12/13/20 11:43 AM   Specimen: Nasopharyngeal Swab; Nasopharyngeal(NP) swabs in vial transport medium  Result Value Ref Range Status   SARS Coronavirus 2 by RT PCR NEGATIVE NEGATIVE Final    Comment: (NOTE) SARS-CoV-2 target nucleic acids are NOT DETECTED.  The SARS-CoV-2 RNA is generally detectable in upper respiratory specimens during the acute phase of infection. The lowest concentration of SARS-CoV-2 viral copies this assay can detect is 138 copies/mL. A negative result does not preclude SARS-Cov-2 infection and should not be used as the sole basis for treatment or other patient management decisions. A negative result may occur with  improper specimen collection/handling, submission of specimen other than nasopharyngeal swab, presence of viral mutation(s) within the areas targeted by this assay, and inadequate number of viral copies(<138 copies/mL). A negative result must be combined with clinical observations, patient history, and epidemiological information. The expected result is Negative.  Fact Sheet for Patients:  EntrepreneurPulse.com.au  Fact Sheet  for Healthcare Providers:  IncredibleEmployment.be  This test is no t yet approved or cleared by the [place] FDA and  has been authorized for detection and/or diagnosis of SARS-CoV-2 by FDA under an Emergency Use Authorization (EUA). This EUA will remain  in effect (meaning this test can be used) for the duration of the COVID-19 declaration under Section 564(b)(1) of the Act, 21 U.S.C.section 360bbb-3(b)(1), unless the authorization is terminated  or revoked sooner.       Influenza A by PCR NEGATIVE NEGATIVE Final   Influenza B by PCR NEGATIVE NEGATIVE Final    Comment: (NOTE) The Xpert Xpress SARS-CoV-2/FLU/RSV plus assay is intended as an aid in the diagnosis of influenza from Nasopharyngeal swab specimens and should not be used as a sole basis for treatment. Nasal washings and aspirates are unacceptable for Xpert Xpress SARS-CoV-2/FLU/RSV testing.  Fact Sheet for Patients: EntrepreneurPulse.com.au  Fact Sheet for Healthcare Providers: IncredibleEmployment.be  This test is not yet approved or cleared by the [place] FDA and has been authorized for detection and/or diagnosis of SARS-CoV-2 by FDA under an Emergency Use Authorization (EUA). This EUA will remain in effect (meaning this test can be used) for the duration of the COVID-19 declaration under Section 564(b)(1) of the Act, 21 U.S.C. section 360bbb-3(b)(1), unless the authorization is terminated or revoked.  Performed  at [facility], [place] [address] [place],  [address]      Labs: Basic Metabolic Panel: Recent Labs  Lab 12/10/20 1542 12/12/20 1234 12/13/20 1154 12/14/20 0338  NA 138 139 138 135  K 3.7 3.6 3.8 3.5  CL 104 109 104 101  CO2 27 26 28 28   GLUCOSE 90 90 107* 94  BUN 11 11 11 9   CREATININE 0.56 0.59 0.63 0.73  CALCIUM 8.9 8.1* 9.1 9.2  MG  --  1.6* 1.9  --    Liver Function Tests: Recent Labs  Lab 12/10/20 1542  12/12/20 1234 12/14/20 0338  AST 17 20 19   ALT 19 19 22   ALKPHOS 59 56 61  BILITOT 0.3 0.5 0.4  PROT 6.8 6.6 6.4*  ALBUMIN 3.4* 3.4* 3.1*   CBC: Recent Labs  Lab 12/10/20 1542 12/12/20 1234 12/13/20 1154 12/14/20 0338  WBC 8.2 6.3 6.4 6.9  NEUTROABS 3.9 3.0 2.9  --   HGB 10.7* 10.2* 11.2* 10.8*  HCT 34.7* 34.1* 37.2 36.0  MCV 79.6* 80.8 80.5 80.4  PLT 276 260 306 261   CBG: Recent Labs  Lab 12/10/20 1434 12/13/20 1709 12/13/20 2147 12/14/20 0801  GLUCAP 84 75 94 100*   Hgb A1c Recent Labs    12/13/20 1505  HGBA1C 6.1*   Lipid Profile No results for input(s): CHOL, HDL, LDLCALC, TRIG, CHOLHDL, LDLDIRECT in the last 72 hours. Thyroid function studies No results for input(s): TSH, T4TOTAL, T3FREE, THYROIDAB in the last 72 hours.  Invalid input(s): FREET3 Urinalysis    Component Value Date/Time   COLORURINE YELLOW 12/02/2017 [place] 12/02/2017 1035   LABSPEC 1.019 12/02/2017 1035   PHURINE 5.0 12/02/2017 1035   GLUCOSEU NEGATIVE 12/02/2017 1035   HGBUR LARGE (A) 12/02/2017 1035   BILIRUBINUR NEGATIVE 12/02/2017 1035   KETONESUR NEGATIVE 12/02/2017 1035   PROTEINUR NEGATIVE 12/02/2017 1035   UROBILINOGEN 0.2 06/23/2014 1928   NITRITE NEGATIVE 12/02/2017 1035   LEUKOCYTESUR NEGATIVE 12/02/2017 1035    FURTHER DISCHARGE INSTRUCTIONS:   Get Medicines reviewed and adjusted: Please take all your medications with you for your next visit with your Primary MD   Laboratory/radiological data: Please request your Primary MD to go over all hospital tests and procedure/radiological results at the follow up, please ask your Primary MD to get all Hospital records sent to his/her office.   In some cases, they will be blood work, cultures and biopsy results pending at the time of your discharge. Please request that your primary care M.D. goes through all the records of your hospital data and follows up on these results.   Also Note the  following: If you experience worsening of your admission symptoms, develop shortness of breath, life threatening emergency, suicidal or homicidal thoughts you must seek medical attention immediately by calling 911 or calling your MD immediately  if symptoms less severe.   You must read complete instructions/literature along with all the possible adverse reactions/side effects for all the Medicines you take and that have been prescribed to you. Take any new Medicines after you have completely understood and accpet all the possible adverse reactions/side effects.    Do not drive when taking Pain medications or sleeping medications (Benzodaizepines)   Do not take more than prescribed Pain, Sleep and Anxiety Medications. It is not advisable to combine anxiety,sleep and pain medications without talking with your primary care practitioner   Special Instructions: If you have smoked or chewed Tobacco  in the last  2 yrs please stop smoking, stop any regular Alcohol  and or any Recreational drug use.   Wear Seat belts while driving.   Please note: You were cared for by a hospitalist during your hospital stay. Once you are discharged, your primary care physician will handle any further medical issues. Please note that NO REFILLS for any discharge medications will be authorized once you are discharged, as it is imperative that you return to your primary care physician (or establish a relationship with a primary care physician if you do not have one) for your post hospital discharge needs so that they can reassess your need for medications and monitor your lab values.  Time coordinating discharge: 40 minutes  SIGNED:  Marzetta Board, MD, PhD 12/14/2020, 11:41 AM

## 2020-12-14 NOTE — ED Notes (Signed)
Social worker contacted for ride home

## 2020-12-14 NOTE — Progress Notes (Addendum)
Neurology Progress Note  Subjective: No acute overnight events Patient initially with concern that the EEG was not run correctly because she did not feel that she had "a full-blown seizure during the event", however, with prolonged discussion with Dr. Rory Percy at the bedside, patient was reassured that the EEG did not capture seizures or epileptiform discharges.   Exam: Vitals:   12/14/20 0645 12/14/20 0845  BP:  (!) 162/117  Pulse: 68 83  Resp: 16 13  Temp:  97.7 F (36.5 C)  SpO2: 95% 95%   Gen: Sitting up in bed, eating breakfast, in no acute distress Resp: non-labored breathing, no respiratory distress Abd: soft, non-distended  Neuro: Mental Status: Awake, alert, and oriented throughout.  Thought content appropriate, converses well.  Speech is intact without dysarthria or aphasia.  No neglect is noted.  Cranial Nerves: PERRL, EOMI, visual fields are full, hearing is intact to voice, shoulders shrug symmetrically, phonation intact, tongue is midline.  Motor: Moves all extremities spontaneously with antigravity movements.  Sensory:Sensation to light touch intact and symmetric Gait: Deferred  Pertinent Labs: CBC    Component Value Date/Time   WBC 6.9 12/14/2020 0338   RBC 4.48 12/14/2020 0338   HGB 10.8 (L) 12/14/2020 0338   HCT 36.0 12/14/2020 0338   PLT 261 12/14/2020 0338   MCV 80.4 12/14/2020 0338   MCH 24.1 (L) 12/14/2020 0338   MCHC 30.0 12/14/2020 0338   RDW 17.4 (H) 12/14/2020 0338   LYMPHSABS 2.8 12/13/2020 1154   MONOABS 0.4 12/13/2020 1154   EOSABS 0.2 12/13/2020 1154   BASOSABS 0.0 12/13/2020 1154   CMP     Component Value Date/Time   NA 135 12/14/2020 0338   K 3.5 12/14/2020 0338   CL 101 12/14/2020 0338   CO2 28 12/14/2020 0338   GLUCOSE 94 12/14/2020 0338   BUN 9 12/14/2020 0338   CREATININE 0.73 12/14/2020 0338   CALCIUM 9.2 12/14/2020 0338   PROT 6.4 (L) 12/14/2020 0338   ALBUMIN 3.1 (L) 12/14/2020 0338   AST 19 12/14/2020 0338   ALT 22  12/14/2020 0338   ALKPHOS 61 12/14/2020 0338   BILITOT 0.4 12/14/2020 0338   GFRNONAA >60 12/14/2020 0338   GFRAA >60 08/21/2018 1212   Imaging Reviewed:  MRI brain wwo contrast: Unremarkable MRI of the brain.  Routine EEG 12/12: "This study is within normal limits. No seizures or epileptiform discharges were seen throughout the recording. Two events were recorded during this study as described above without concomitant eeg change and were NON-epileptic events."  Assessment: 30 year old female with reported history of seizures prescribed Keppra by her outpatient PCP who presented to the ED for 2 weeks of reported frequent seizures at home.  Patient was evaluated 12/11 at [facility] but left AMA prior to admission.  She returned 12/12 for evaluation of 2 further seizures while at home. - Examination reveals patient is awake and alert without focal neurologic deficit.  Patient reportedly had a seizure event while in the ED 12/12 that consisted of head shaking while patient remained alert and on provider evaluation, patient appeared to be postictal. -- Routine EEG captured two events without concomitant EEG change and were nonepileptic events.  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 These events were discussed in detail with patient at bedside by Dr. Rory Percy.   Recommendations: - Continue Keppra 1,000 mg PO BID - Will need outpatient neurology follow up in 6-8 weeks for further evaluation and titration of Keppra - Will need outpatient  psychiatry follow up  -  Discussed [facility] statutes, patients with seizures are not allowed to drive until they have been seizure-free for six months. Use caution when using heavy equipment or power tools. Avoid working on ladders or at heights. Take showers instead of baths. Ensure the water temperature is not too high on the home water heater. Do not go swimming alone. Do not lock yourself in a room alone (i.e.  bathroom). When caring for infants or small children, sit down when holding, feeding, or changing them to minimize risk of injury to the child in the event you have a seizure. Maintain good sleep hygiene. Avoid alcohol.  - No further inpatient neurology work up indicated at this time. Please call for further questions or concerns.  Anibal Henderson, AGACNP-BC [facility] [phone]   Attending addendum Patient seen and examined. Imaging personally reviewed-MRI brain normal. 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 Discussed in detail with the patient the difference between epileptic and nonepileptic spells. Answered all her questions.  Seizure precautions including no driving for 6 months per state law reiterated. She will need outpatient psychiatry and neurology follow-up. I will continue her on Keppra for now because she has been on it for a while and majority of the patients with PNES to have underlying seizure disorder. Plan was relayed to Dr. Cruzita Lederer over secure chat.    -- Amie Portland, MD Neurologist [facility] Pager: [phone]  SEIZURE PRECAUTIONS Per [facility] statutes, patients with seizures are not allowed to drive until they have been seizure-free for six months.   Use caution when using heavy equipment or power tools. Avoid working on ladders or at heights. Take showers instead of baths. Ensure the water temperature is not too high on the home water heater. Do not go swimming alone. Do not lock yourself in a room alone (i.e. bathroom). When caring for infants or small children, sit down when holding, feeding, or changing them to minimize risk of injury to the child in the event you have a seizure. Maintain good sleep hygiene. Avoid alcohol.    If patient has another seizure, call 911 and bring them back to the ED if: A.  The seizure lasts longer than 5 minutes.      B.  The patient  doesn't wake shortly after the seizure or has new problems such as difficulty seeing, speaking or moving following the seizure C.  The patient was injured during the seizure D.  The patient has a temperature over 102 F (39C) E.  The patient vomited during the seizure and now is having trouble breathing

## 2020-12-14 NOTE — Discharge Instructions (Signed)
Follow with PCP in 1-2 weeks  Please get a complete blood count and chemistry panel checked by your Primary MD at your next visit, and again as instructed by your Primary MD. Please get your medications reviewed and adjusted by your Primary MD.  Please request your Primary MD to go over all Hospital Tests and Procedure/Radiological results at the follow up, please get all Hospital records sent to your Prim MD by signing hospital release before you go home.  In some cases, there will be blood work, cultures and biopsy results pending at the time of your discharge. Please request that your primary care M.D. goes through all the records of your hospital data and follows up on these results.  If you had Pneumonia of Lung problems at the Hospital: Please get a 2 view Chest X ray done in 6-8 weeks after hospital discharge or sooner if instructed by your Primary MD.  If you have Congestive Heart Failure: Please call your Cardiologist or Primary MD anytime you have any of the following symptoms:  1) 3 pound weight gain in 24 hours or 5 pounds in 1 week  2) shortness of breath, with or without a dry hacking cough  3) swelling in the hands, feet or stomach  4) if you have to sleep on extra pillows at night in order to breathe  Follow cardiac low salt diet and 1.5 lit/day fluid restriction.  If you have diabetes Accuchecks 4 times/day, Once in AM empty stomach and then before each meal. Log in all results and show them to your primary doctor at your next visit. If any glucose reading is under 80 or above 300 call your primary MD immediately.  If you have Seizure/Convulsions/Epilepsy: Please do not drive, operate heavy machinery, participate in activities at heights or participate in high speed sports until you have seen by Primary MD or a Neurologist and advised to do so again. Per [facility] statutes, patients with seizures are not allowed to drive until they have been seizure-free for six  months.  Use caution when using heavy equipment or power tools. Avoid working on ladders or at heights. Take showers instead of baths. Ensure the water temperature is not too high on the home water heater. Do not go swimming alone. Do not lock yourself in a room alone (i.e. bathroom). When caring for infants or small children, sit down when holding, feeding, or changing them to minimize risk of injury to the child in the event you have a seizure. Maintain good sleep hygiene. Avoid alcohol.   If you had Gastrointestinal Bleeding: Please ask your Primary MD to check a complete blood count within one week of discharge or at your next visit. Your endoscopic/colonoscopic biopsies that are pending at the time of discharge, will also need to followed by your Primary MD.  Get Medicines reviewed and adjusted. Please take all your medications with you for your next visit with your Primary MD  Please request your Primary MD to go over all hospital tests and procedure/radiological results at the follow up, please ask your Primary MD to get all Hospital records sent to his/her office.  If you experience worsening of your admission symptoms, develop shortness of breath, life threatening emergency, suicidal or homicidal thoughts you must seek medical attention immediately by calling 911 or calling your MD immediately  if symptoms less severe.  You must read complete instructions/literature along with all the possible adverse reactions/side effects for all the Medicines you take and that  have been prescribed to you. Take any new Medicines after you have completely understood and accpet all the possible adverse reactions/side effects.   Do not drive or operate heavy machinery when taking Pain medications.   Do not take more than prescribed Pain, Sleep and Anxiety Medications  Special Instructions: If you have smoked or chewed Tobacco  in the last 2 yrs please stop smoking, stop any regular Alcohol  and or any  Recreational drug use.  Wear Seat belts while driving.  Please note You were cared for by a hospitalist during your hospital stay. If you have any questions about your discharge medications or the care you received while you were in the hospital after you are discharged, you can call the unit and asked to speak with the hospitalist on call if the hospitalist that took care of you is not available. Once you are discharged, your primary care physician will handle any further medical issues. Please note that NO REFILLS for any discharge medications will be authorized once you are discharged, as it is imperative that you return to your primary care physician (or establish a relationship with a primary care physician if you do not have one) for your aftercare needs so that they can reassess your need for medications and monitor your lab values.  You can reach the hospitalist office at phone [phone] or fax [phone]   If you do not have a primary care physician, you can call [phone] for a physician referral.  Activity: As tolerated with Full fall precautions use walker/cane & assistance as needed    Diet: heart healthy  Disposition Home

## 2020-12-23 ENCOUNTER — Ambulatory Visit: Payer: Self-pay | Admitting: Nurse Practitioner

## 2021-01-05 ENCOUNTER — Ambulatory Visit: Payer: Self-pay | Admitting: Neurology

## 2021-01-06 ENCOUNTER — Encounter: Payer: Self-pay | Admitting: Neurology

## 2021-05-17 ENCOUNTER — Encounter (HOSPITAL_BASED_OUTPATIENT_CLINIC_OR_DEPARTMENT_OTHER): Payer: Self-pay | Admitting: *Deleted

## 2021-05-17 ENCOUNTER — Emergency Department (HOSPITAL_BASED_OUTPATIENT_CLINIC_OR_DEPARTMENT_OTHER): Payer: BC Managed Care – PPO

## 2021-05-17 ENCOUNTER — Emergency Department (HOSPITAL_BASED_OUTPATIENT_CLINIC_OR_DEPARTMENT_OTHER)
Admission: EM | Admit: 2021-05-17 | Discharge: 2021-05-17 | Disposition: A | Discharge status: Home / Self Care | Payer: BC Managed Care – PPO | Attending: Emergency Medicine | Admitting: Emergency Medicine

## 2021-05-17 ENCOUNTER — Other Ambulatory Visit: Payer: Self-pay

## 2021-05-17 ENCOUNTER — Other Ambulatory Visit (HOSPITAL_BASED_OUTPATIENT_CLINIC_OR_DEPARTMENT_OTHER): Payer: Self-pay

## 2021-05-17 DIAGNOSIS — Z79899 Other long term (current) drug therapy: Secondary | ICD-10-CM | POA: Insufficient documentation

## 2021-05-17 DIAGNOSIS — M79672 Pain in left foot: Secondary | ICD-10-CM | POA: Diagnosis present

## 2021-05-17 DIAGNOSIS — M79662 Pain in left lower leg: Secondary | ICD-10-CM | POA: Diagnosis not present

## 2021-05-17 MED ORDER — PREDNISONE 20 MG PO TABS
20.0000 mg | ORAL_TABLET | Freq: Every day | ORAL | 0 refills | Status: AC
  Filled 2021-05-17: qty 5, 5d supply, fill #0

## 2021-05-17 MED ORDER — OXYCODONE HCL 5 MG PO TABS
5.0000 mg | ORAL_TABLET | Freq: Once | ORAL | Status: AC
  Administered 2021-05-17: 5 mg via ORAL
  Filled 2021-05-17: qty 1

## 2021-05-17 NOTE — ED Notes (Signed)
Presents with left leg/ foot pain, noted swelling in LLE, states this has been ongoing for the past 6 months, weak plantar and dorsal flexion of left foot, temp WNL, capillary refill WNL. Weak left pedal and left post tib. ED MD at bedside ?

## 2021-05-17 NOTE — ED Triage Notes (Signed)
Presents with left leg pain and swelling x 6 weeks, when ambulating pain is worse. States she does not have any feeling in it ?

## 2021-05-17 NOTE — ED Provider Notes (Addendum)
?[place] EMERGENCY DEPARTMENT ?Provider Note ? ? ?CSN: [id number] ?Arrival date & time: 05/17/21  0844 ? ?  ? ?History ? ?Chief Complaint  ?Patient presents with  ? Leg Pain  ? ? ?Alicia Becker is a 31 y.o. female. ? ?Patient here with left lower leg pain.  Pain mostly in the lower left leg in the lower calf, ankle area.  Pain for the last 6 weeks.  History of DVT.  Walking makes it worse.  Rest makes it feel better.  Sometimes she feels like she has bad circulation in her feet.  Over-the-counter medications have not helped much.  Denies any recent surgery or travel.  Denies any fever or chills.  Denies any trauma. ? ?The history is provided by the patient.  ? ?  ? ?Home Medications ?Prior to Admission medications   ?Medication Sig Start Date End Date Taking? Authorizing Provider  ?predniSONE (DELTASONE) 20 MG tablet Take 1 tablet (20 mg total) by mouth daily for 5 days. 05/17/21 05/22/21 Yes Adylin Hankey, DO  ?albuterol (VENTOLIN HFA) 108 (90 Base) MCG/ACT inhaler Inhale 2 puffs into the lungs every 4 (four) hours as needed for wheezing or shortness of breath. 02/28/19   Faustino Congress, NP  ?amLODipine (NORVASC) 5 MG tablet Take 1 tablet (5 mg total) by mouth daily. 12/14/20   Caren Griffins, MD  ?diphenhydramine-acetaminophen (TYLENOL PM) 25-500 MG TABS tablet Take 1 tablet by mouth at bedtime as needed (pain/fever).    [provider]  ?hydrochlorothiazide (HYDRODIURIL) 25 MG tablet Take 12.5 mg by mouth daily. 12/01/20   [provider]  ?levETIRAcetam (KEPPRA) 1000 MG tablet Take 1,000 mg by mouth 2 (two) times daily. 12/01/20   [provider]  ?   ? ?Allergies    ?Watermelon flavor, Ibuprofen, Toradol [ketorolac tromethamine], and Tramadol   ? ?Review of Systems   ?Review of Systems ? ?Physical Exam ?Updated Vital Signs ?BP (!) 139/103 (BP Location: Right Arm)   Pulse 76   Temp 98.2 ?F (36.8 ?C) (Oral)   Resp 18   Ht '5\' 3"'$  (1.6 m)   Wt (!) 160.6 kg   SpO2 100%    BMI 62.72 kg/m?  ?Physical Exam ?Constitutional:   ?   General: She is not in acute distress. ?   Appearance: She is not ill-appearing.  ?HENT:  ?   Mouth/Throat:  ?   Mouth: Mucous membranes are moist.  ?Eyes:  ?   Pupils: Pupils are equal, round, and reactive to light.  ?Cardiovascular:  ?   Rate and Rhythm: Normal rate.  ?   Pulses: Normal pulses.  ?   Heart sounds: Normal heart sounds.  ?Musculoskeletal:     ?   General: Tenderness present. Normal range of motion.  ?   Cervical back: Normal range of motion.  ?   Comments: Tenderness to the left lateral malleolus, left lower calf  ?Skin: ?   General: Skin is warm.  ?   Findings: No erythema.  ?Neurological:  ?   General: No focal deficit present.  ?   Mental Status: She is alert.  ?   Sensory: No sensory deficit.  ?   Motor: No weakness.  ?   Comments: 5+ out of 5 strength in the lower extremity, normal sensation  ? ? ?ED Results / Procedures / Treatments   ?Labs ?(all labs ordered are listed, but only abnormal results are displayed) ?Labs Reviewed - No data to display ? ?EKG ?None ? ?  Radiology ?DG Ankle Complete Left ? ?Result Date: 05/17/2021 ?CLINICAL DATA:  Chronic left ankle pain and swelling without known injury. EXAM: LEFT ANKLE COMPLETE - 3+ VIEW COMPARISON:  None Available. FINDINGS: There is no evidence of fracture, dislocation, or joint effusion. There is no evidence of arthropathy or other focal bone abnormality. Soft tissues are unremarkable. IMPRESSION: Negative. Electronically Signed   By: Marijo Conception M.D.   On: 05/17/2021 09:20  ? ?US Venous Img Lower  Left (DVT Study) ? ?Result Date: 05/17/2021 ?CLINICAL DATA:  Left leg pain for 6 weeks.  Lower extremity edema. EXAM: LEFT LOWER EXTREMITY VENOUS DOPPLER ULTRASOUND TECHNIQUE: Gray-scale sonography with compression, as well as color and duplex ultrasound, were performed to evaluate the deep venous system(s) from the level of the common femoral vein through the popliteal and proximal calf veins.  COMPARISON:  None Available. FINDINGS: VENOUS Normal compressibility of the common femoral, superficial femoral, and popliteal veins, as well as the visualized calf veins, although calf vein assessment is limited due to body habitus. Visualized portions of profunda femoral vein and great saphenous vein unremarkable. No filling defects to suggest DVT on grayscale or color Doppler imaging. Doppler waveforms show normal direction of venous flow, normal respiratory plasticity and response to augmentation. Limited views of the contralateral common femoral vein are unremarkable. OTHER None. Limitations: Body habitus. IMPRESSION: 1. No left lower extremity DVT identified. Electronically Signed   By: Van Clines M.D.   On: 05/17/2021 10:09   ? ?Procedures ?Procedures  ? ? ?Medications Ordered in ED ?Medications  ?oxyCODONE (Oxy IR/ROXICODONE) immediate release tablet 5 mg (5 mg Oral Given 05/17/21 0936)  ? ? ?ED Course/ Medical Decision Making/ A&P ?  ?                        ?Medical Decision Making ?Amount and/or Complexity of Data Reviewed ?Radiology: ordered. ? ?Risk ?Prescription drug management. ? ? ?Alicia Becker is here with left lower leg pain.  History of DVT not on blood thinners.  Normal vitals.  No fever.  Differential diagnosis is inflammatory process versus DVT versus less likely fracture or sprain.  Could be arthritic process.  I have no concern for septic joint.  We will get a DVT ultrasound of the left lower extremity, will get x-ray.  We will give a dose of pain medicine while in the ED.  Neurovascular neuromuscularly intact on exam.  Sounds like a chronic issue with symptoms for the last several weeks and symptoms in the past. ? ?DVT study normal.  Per my review and interpretation of x-ray no fracture dislocation or major inflammation.  Will place in a walking boot given chronicity of the pain think that this is inflammatory and overuse.  We will try to take some pressure off of the joint with  walking boot.  We will write her for short course of steroids and have her follow-up with podiatry.  Discharged in good condition. ? ?This chart was dictated using voice recognition software.  Despite best efforts to proofread,  errors can occur which can change the documentation meaning.  ? ? ? ? ? ? ? ?Final Clinical Impression(s) / ED Diagnoses ?Final diagnoses:  ?Left foot pain  ? ? ?Rx / DC Orders ?ED Discharge Orders   ? ?      Ordered  ?  predniSONE (DELTASONE) 20 MG tablet  Daily       ? 05/17/21 1014  ? ?  ?  ? ?  ? ? ?  ?  Lennice Sites, DO ?05/17/21 1016 ? ?  ?Lennice Sites, DO ?05/17/21 1016 ? ?

## 2021-05-17 NOTE — ED Notes (Signed)
Placed in pt gown, requested pt remove pants for [place] as ordered by ED MD ?

## 2021-05-17 NOTE — ED Notes (Signed)
ED MD assessing pulses via doppler . Doppler pedal pulse on left by MD ?

## 2021-05-17 NOTE — Discharge Instructions (Addendum)
Use walking boot for comfort.  Recommend Tylenol for pain.  I have prescribed you a short course of steroids to help with any inflammation.  Follow-up with Dr. Amalia Hailey with podiatry/foot team. ?

## 2021-05-26 ENCOUNTER — Other Ambulatory Visit (HOSPITAL_BASED_OUTPATIENT_CLINIC_OR_DEPARTMENT_OTHER): Payer: Self-pay

## 2021-10-17 ENCOUNTER — Emergency Department (HOSPITAL_COMMUNITY)
Admission: EM | Admit: 2021-10-17 | Discharge: 2021-10-17 | Discharge status: Against Medical Advice | Payer: BC Managed Care – PPO | Attending: Emergency Medicine | Admitting: Emergency Medicine

## 2021-10-17 ENCOUNTER — Encounter (HOSPITAL_COMMUNITY): Payer: Self-pay | Admitting: Emergency Medicine

## 2021-10-17 ENCOUNTER — Other Ambulatory Visit: Payer: Self-pay

## 2021-10-17 DIAGNOSIS — K5900 Constipation, unspecified: Secondary | ICD-10-CM | POA: Insufficient documentation

## 2021-10-17 DIAGNOSIS — R109 Unspecified abdominal pain: Secondary | ICD-10-CM | POA: Diagnosis present

## 2021-10-17 DIAGNOSIS — R0789 Other chest pain: Secondary | ICD-10-CM | POA: Diagnosis not present

## 2021-10-17 DIAGNOSIS — Z5321 Procedure and treatment not carried out due to patient leaving prior to being seen by health care provider: Secondary | ICD-10-CM | POA: Insufficient documentation

## 2021-10-17 DIAGNOSIS — M545 Low back pain, unspecified: Secondary | ICD-10-CM | POA: Diagnosis not present

## 2021-10-17 LAB — COMPREHENSIVE METABOLIC PANEL
ALT: 14 U/L (ref 0–44)
AST: 15 U/L (ref 15–41)
Albumin: 3.6 g/dL (ref 3.5–5.0)
Alkaline Phosphatase: 67 U/L (ref 38–126)
Anion gap: 5 (ref 5–15)
BUN: 9 mg/dL (ref 6–20)
CO2: 28 mmol/L (ref 22–32)
Calcium: 9 mg/dL (ref 8.9–10.3)
Chloride: 105 mmol/L (ref 98–111)
Creatinine, Ser: 0.58 mg/dL (ref 0.44–1.00)
GFR, Estimated: >60 mL/min (ref >60)
Glucose, Bld: 89 mg/dL (ref 70–99)
Potassium: 3.7 mmol/L (ref 3.5–5.1)
Sodium: 138 mmol/L (ref 135–145)
Total Bilirubin: 0.3 mg/dL (ref 0.3–1.2)
Total Protein: 7 g/dL (ref 6.5–8.1)

## 2021-10-17 LAB — URINALYSIS, ROUTINE W REFLEX MICROSCOPIC
Bilirubin Urine: NEGATIVE
Glucose, UA: NEGATIVE mg/dL
Hgb urine dipstick: NEGATIVE
Ketones, ur: NEGATIVE mg/dL
Leukocytes,Ua: NEGATIVE
Nitrite: NEGATIVE
Protein, ur: NEGATIVE mg/dL
Specific Gravity, Urine: 1.027 (ref 1.005–1.030)
pH: 5 (ref 5.0–8.0)

## 2021-10-17 LAB — CBC WITH DIFFERENTIAL/PLATELET
Abs Immature Granulocytes: 0.01 10*3/uL (ref 0.00–0.07)
Basophils Absolute: 0 10*3/uL (ref 0.0–0.1)
Basophils Relative: 0 %
Eosinophils Absolute: 0.4 10*3/uL (ref 0.0–0.5)
Eosinophils Relative: 5 %
HCT: 38.4 % (ref 36.0–46.0)
Hemoglobin: 11.2 g/dL — ABNORMAL LOW (ref 12.0–15.0)
Immature Granulocytes: 0 %
Lymphocytes Relative: 44 %
Lymphs Abs: 3.6 10*3/uL (ref 0.7–4.0)
MCH: 23.1 pg — ABNORMAL LOW (ref 26.0–34.0)
MCHC: 29.2 g/dL — ABNORMAL LOW (ref 30.0–36.0)
MCV: 79.3 fL — ABNORMAL LOW (ref 80.0–100.0)
Monocytes Absolute: 0.6 10*3/uL (ref 0.1–1.0)
Monocytes Relative: 7 %
Neutro Abs: 3.6 10*3/uL (ref 1.7–7.7)
Neutrophils Relative %: 44 %
Platelets: 344 10*3/uL (ref 150–400)
RBC: 4.84 MIL/uL (ref 3.87–5.11)
RDW: 17.3 % — ABNORMAL HIGH (ref 11.5–15.5)
WBC: 8.2 10*3/uL (ref 4.0–10.5)
nRBC: 0 % (ref 0.0–0.2)

## 2021-10-17 LAB — LIPASE, BLOOD: Lipase: 32 U/L (ref 11–51)

## 2021-10-17 LAB — I-STAT BETA HCG BLOOD, ED (MC, WL, AP ONLY): I-stat hCG, quantitative: <5 m[IU]/mL (ref <5)

## 2021-10-17 NOTE — ED Provider Triage Note (Signed)
Emergency Medicine Provider Triage Evaluation Note  Alicia Becker , a 31 y.o. female  was evaluated in triage.  Pt complains of flank pain. States flank pain began within the past week. Has been persistent left sided flank pain that does not radiate. States her urine has been darker. Denies dysuria. She denies fever. Denies fall or back injury. States she has not had a bowel movement in 3 weeks and is unsure if this is part of pain as well..  Review of Systems  Positive:  Negative:   Physical Exam  BP 129/81 (BP Location: Right Arm)   Pulse 72   Temp 97.8 F (36.6 C) (Oral)   Resp 18   SpO2 95%  Gen:   Awake, no distress   Resp:  Normal effort  MSK:   Moves extremities without difficulty  Other:  In moderate pain on exam. L CVAT. No midline tenderness. Abdomen is protuberant and soft. No abdominal pain to palpation.  Medical Decision Making  Medically screening exam initiated at 1:08 PM.  Appropriate orders placed.  [name] was informed that the remainder of the evaluation will be completed by another provider, this initial triage assessment does not replace that evaluation, and the importance of remaining in the ED until their evaluation is complete.     Mickie Hillier, PA-C 10/17/21 1310

## 2021-10-17 NOTE — ED Notes (Addendum)
Pt called for VS, no response x1 

## 2021-10-17 NOTE — ED Notes (Signed)
Pt called repeatedly for VS, no response

## 2021-10-17 NOTE — ED Triage Notes (Addendum)
Patient complains of left lower back pain and constipation that started four weeks ago, patient states she has tried different stool softeners and laxatives with no relief in symptoms, patient states she has not passed any gas nor any stool during that time. Patient is alert, oriented, and in no apparent distress at this time.  Patient also complains of intermittent left-sided sharp chest pain that started last night.

## 2021-10-19 ENCOUNTER — Emergency Department (HOSPITAL_BASED_OUTPATIENT_CLINIC_OR_DEPARTMENT_OTHER)
Admission: EM | Admit: 2021-10-19 | Discharge: 2021-10-19 | Disposition: A | Discharge status: Home / Self Care | Payer: BC Managed Care – PPO | Attending: Emergency Medicine | Admitting: Emergency Medicine

## 2021-10-19 ENCOUNTER — Other Ambulatory Visit: Payer: Self-pay

## 2021-10-19 ENCOUNTER — Emergency Department (HOSPITAL_BASED_OUTPATIENT_CLINIC_OR_DEPARTMENT_OTHER): Payer: BC Managed Care – PPO

## 2021-10-19 ENCOUNTER — Encounter (HOSPITAL_BASED_OUTPATIENT_CLINIC_OR_DEPARTMENT_OTHER): Payer: Self-pay | Admitting: Emergency Medicine

## 2021-10-19 DIAGNOSIS — I10 Essential (primary) hypertension: Secondary | ICD-10-CM | POA: Insufficient documentation

## 2021-10-19 DIAGNOSIS — K5900 Constipation, unspecified: Secondary | ICD-10-CM | POA: Insufficient documentation

## 2021-10-19 DIAGNOSIS — M545 Low back pain, unspecified: Secondary | ICD-10-CM | POA: Insufficient documentation

## 2021-10-19 DIAGNOSIS — Z79899 Other long term (current) drug therapy: Secondary | ICD-10-CM | POA: Insufficient documentation

## 2021-10-19 LAB — CBC WITH DIFFERENTIAL/PLATELET
Abs Immature Granulocytes: 0.02 10*3/uL (ref 0.00–0.07)
Basophils Absolute: 0 10*3/uL (ref 0.0–0.1)
Basophils Relative: 0 %
Eosinophils Absolute: 0.2 10*3/uL (ref 0.0–0.5)
Eosinophils Relative: 3 %
HCT: 34.3 % — ABNORMAL LOW (ref 36.0–46.0)
Hemoglobin: 10.4 g/dL — ABNORMAL LOW (ref 12.0–15.0)
Immature Granulocytes: 0 %
Lymphocytes Relative: 44 %
Lymphs Abs: 3.6 10*3/uL (ref 0.7–4.0)
MCH: 23.5 pg — ABNORMAL LOW (ref 26.0–34.0)
MCHC: 30.3 g/dL (ref 30.0–36.0)
MCV: 77.6 fL — ABNORMAL LOW (ref 80.0–100.0)
Monocytes Absolute: 0.7 10*3/uL (ref 0.1–1.0)
Monocytes Relative: 9 %
Neutro Abs: 3.6 10*3/uL (ref 1.7–7.7)
Neutrophils Relative %: 44 %
Platelets: 330 10*3/uL (ref 150–400)
RBC: 4.42 MIL/uL (ref 3.87–5.11)
RDW: 17.5 % — ABNORMAL HIGH (ref 11.5–15.5)
WBC: 8.1 10*3/uL (ref 4.0–10.5)
nRBC: 0 % (ref 0.0–0.2)

## 2021-10-19 LAB — URINALYSIS, ROUTINE W REFLEX MICROSCOPIC
Bilirubin Urine: NEGATIVE
Glucose, UA: NEGATIVE mg/dL
Hgb urine dipstick: NEGATIVE
Ketones, ur: NEGATIVE mg/dL
Leukocytes,Ua: NEGATIVE
Nitrite: NEGATIVE
Protein, ur: NEGATIVE mg/dL
Specific Gravity, Urine: 1.025 (ref 1.005–1.030)
pH: 6 (ref 5.0–8.0)

## 2021-10-19 LAB — PREGNANCY, URINE: Preg Test, Ur: NEGATIVE

## 2021-10-19 LAB — COMPREHENSIVE METABOLIC PANEL
ALT: 14 U/L (ref 0–44)
AST: 14 U/L — ABNORMAL LOW (ref 15–41)
Albumin: 3.4 g/dL — ABNORMAL LOW (ref 3.5–5.0)
Alkaline Phosphatase: 64 U/L (ref 38–126)
Anion gap: 3 — ABNORMAL LOW (ref 5–15)
BUN: 12 mg/dL (ref 6–20)
CO2: 28 mmol/L (ref 22–32)
Calcium: 8.4 mg/dL — ABNORMAL LOW (ref 8.9–10.3)
Chloride: 107 mmol/L (ref 98–111)
Creatinine, Ser: 0.55 mg/dL (ref 0.44–1.00)
GFR, Estimated: >60 mL/min (ref >60)
Glucose, Bld: 80 mg/dL (ref 70–99)
Potassium: 3.6 mmol/L (ref 3.5–5.1)
Sodium: 138 mmol/L (ref 135–145)
Total Bilirubin: 0.1 mg/dL — ABNORMAL LOW (ref 0.3–1.2)
Total Protein: 6.8 g/dL (ref 6.5–8.1)

## 2021-10-19 MED ORDER — LIDOCAINE HCL URETHRAL/MUCOSAL 2 % EX GEL
1.0000 | Freq: Once | CUTANEOUS | Status: AC
  Administered 2021-10-19: 1 via TOPICAL
  Filled 2021-10-19: qty 11

## 2021-10-19 MED ORDER — FLEET ENEMA 7-19 GM/118ML RE ENEM
1.0000 | ENEMA | Freq: Once | RECTAL | Status: AC
  Administered 2021-10-19: 1 via RECTAL
  Filled 2021-10-19: qty 1

## 2021-10-19 MED ORDER — SODIUM CHLORIDE 0.9 % IV BOLUS
1000.0000 mL | Freq: Once | INTRAVENOUS | Status: AC
  Administered 2021-10-19: 1000 mL via INTRAVENOUS

## 2021-10-19 MED ORDER — MORPHINE SULFATE (PF) 4 MG/ML IV SOLN
4.0000 mg | Freq: Once | INTRAVENOUS | Status: AC
  Administered 2021-10-19: 4 mg via INTRAVENOUS
  Filled 2021-10-19: qty 1

## 2021-10-19 MED ORDER — HYDROMORPHONE HCL 1 MG/ML IJ SOLN
1.0000 mg | Freq: Once | INTRAMUSCULAR | Status: AC
  Administered 2021-10-19: 1 mg via INTRAVENOUS
  Filled 2021-10-19: qty 1

## 2021-10-19 MED ORDER — LORAZEPAM 2 MG/ML IJ SOLN
1.0000 mg | Freq: Once | INTRAMUSCULAR | Status: AC
  Administered 2021-10-19: 1 mg via INTRAVENOUS
  Filled 2021-10-19: qty 1

## 2021-10-19 MED ORDER — POLYETHYLENE GLYCOL 3350 17 G PO PACK: 17.0000 g | PACK | Freq: Two times a day (BID) | ORAL | 0 refills | Status: AC

## 2021-10-19 NOTE — ED Provider Notes (Signed)
[place] EMERGENCY DEPARTMENT Provider Note   CSN: [id number] Arrival date & time: 10/19/21  1655     History  Chief Complaint  Patient presents with   Back Pain   Constipation    Lucetta S Petras is a 31 y.o. female history of constipation, hypertension here presenting with constipation.  Patient states that she has not had a bowel movement for the last 3 weeks.  She has tried enemas as well as suppositories and stool softeners.  She went to [facility] 2 days ago and left without being seen.  She denies any vomiting.  She states that she has a history of constipation.  The history is provided by the patient.       Home Medications Prior to Admission medications   Medication Sig Start Date End Date Taking? Authorizing Provider  albuterol (VENTOLIN HFA) 108 (90 Base) MCG/ACT inhaler Inhale 2 puffs into the lungs every 4 (four) hours as needed for wheezing or shortness of breath. 02/28/19   Faustino Congress, NP  amLODipine (NORVASC) 5 MG tablet Take 1 tablet (5 mg total) by mouth daily. 12/14/20   Caren Griffins, MD  diphenhydramine-acetaminophen (TYLENOL PM) 25-500 MG TABS tablet Take 1 tablet by mouth at bedtime as needed (pain/fever).    [provider]  hydrochlorothiazide (HYDRODIURIL) 25 MG tablet Take 12.5 mg by mouth daily. 12/01/20   [provider]  levETIRAcetam (KEPPRA) 1000 MG tablet Take 1,000 mg by mouth 2 (two) times daily. 12/01/20   [provider]      Allergies    Watermelon flavor, Tramadol, Codeine, Hydrocodone, Ibuprofen, and Toradol [ketorolac tromethamine]    Review of Systems   Review of Systems  Gastrointestinal:  Positive for constipation.  Musculoskeletal:  Positive for back pain.  All other systems reviewed and are negative.   Physical Exam Updated Vital Signs BP (!) 164/108 (BP Location: Right Arm)   Pulse 79   Temp 98.5 F (36.9 C) (Oral)   Resp 18   Ht '5\' 3"'$  (1.6 m)   Wt (!) 145.2 kg   LMP 09/19/2021  (Approximate)   SpO2 98%   BMI 56.69 kg/m  Physical Exam Vitals and nursing note reviewed.  Constitutional:      Comments: Uncomfortable, overweight   HENT:     Head: Normocephalic.     Mouth/Throat:     Mouth: Mucous membranes are dry.  Eyes:     Extraocular Movements: Extraocular movements intact.     Pupils: Pupils are equal, round, and reactive to light.  Cardiovascular:     Rate and Rhythm: Normal rate and regular rhythm.     Pulses: Normal pulses.     Heart sounds: Normal heart sounds.  Pulmonary:     Effort: Pulmonary effort is normal.     Breath sounds: Normal breath sounds.  Abdominal:     Comments: Mild left lower quadrant tenderness.  Musculoskeletal:        General: Normal range of motion.     Cervical back: Normal range of motion and neck supple.  Skin:    General: Skin is warm.     Capillary Refill: Capillary refill takes less than 2 seconds.  Neurological:     General: No focal deficit present.     Mental Status: She is alert and oriented to person, place, and time.  Psychiatric:        Mood and Affect: Mood normal.        Behavior: Behavior normal.  ED Results / Procedures / Treatments   Labs (all labs ordered are listed, but only abnormal results are displayed) Labs Reviewed  PREGNANCY, URINE  URINALYSIS, ROUTINE W REFLEX MICROSCOPIC  CBC WITH DIFFERENTIAL/PLATELET  COMPREHENSIVE METABOLIC PANEL    EKG None  Radiology No results found.  Procedures Procedures    Medications Ordered in ED Medications  lidocaine (XYLOCAINE) 2 % jelly 1 Application (has no administration in time range)  sodium chloride 0.9 % bolus 1,000 mL (has no administration in time range)  sodium phosphate (FLEET) 7-19 GM/118ML enema 1 enema (has no administration in time range)  morphine (PF) 4 MG/ML injection 4 mg (has no administration in time range)  LORazepam (ATIVAN) injection 1 mg (has no administration in time range)    ED Course/ Medical Decision  Making/ A&P                           Medical Decision Making Marizol MAHI ZABRISKIE is a 31 y.o. female here presenting with constipation. Patient has been constipated for the last 3 weeks.  Patient has tried multiple remedies with minimal relief.  We will try to get labs and x-rays and fleets enema.  7:02 PM I reviewed patient's labs and independently interpreted imaging studies.  Patient's x-ray showed constipation and labs were unremarkable.  Urinalysis is normal.  I was able to pass a 22 [place] Foley catheter into the rectum and put fleets enema.  Patient was able to have a large bowel movement afterwards and the Foley catheter came out as well.  I told patient that she likely will need a bowel regimen.  We will start with MiraLAX twice a day and I told her that she likely will need to follow-up with a GI doctor.  Problems Addressed: Constipation, unspecified constipation type: acute illness or injury  Amount and/or Complexity of Data Reviewed Labs: ordered. Decision-making details documented in ED Course. Radiology: ordered and independent interpretation performed. Decision-making details documented in ED Course.  Risk OTC drugs. Prescription drug management.    Final Clinical Impression(s) / ED Diagnoses Final diagnoses:  None    Rx / DC Orders ED Discharge Orders     None         Drenda Freeze, MD 10/19/21 1904

## 2021-10-19 NOTE — ED Triage Notes (Addendum)
Pt reports left lower back pain and no BM in 3 weeks. No relief with stool softeners and laxatives. Attempted to be seen at [facility] 2 days ago but LWBS due to long wait times. Had labs and UA completed at that time.

## 2021-10-19 NOTE — Discharge Instructions (Addendum)
Take MiraLAX twice daily to help you with constipation.  I recommend that you increase fiber intake as well.  You need to call GI for follow-up and they likely will need to start you on a bowel regiment  Return to ER if you have worse abdominal pain, constipation, vomiting.

## 2021-10-19 NOTE — ED Notes (Signed)
Pt able to have a bowel movement

## 2021-11-10 ENCOUNTER — Emergency Department: Payer: BC Managed Care – PPO

## 2021-11-10 ENCOUNTER — Emergency Department (HOSPITAL_BASED_OUTPATIENT_CLINIC_OR_DEPARTMENT_OTHER)
Admission: EM | Admit: 2021-11-10 | Discharge: 2021-11-10 | Disposition: A | Discharge status: Home / Self Care | Payer: BC Managed Care – PPO | Attending: Emergency Medicine | Admitting: Emergency Medicine

## 2021-11-10 ENCOUNTER — Other Ambulatory Visit: Payer: Self-pay

## 2021-11-10 ENCOUNTER — Emergency Department (HOSPITAL_BASED_OUTPATIENT_CLINIC_OR_DEPARTMENT_OTHER): Payer: BC Managed Care – PPO

## 2021-11-10 ENCOUNTER — Encounter (HOSPITAL_BASED_OUTPATIENT_CLINIC_OR_DEPARTMENT_OTHER): Payer: Self-pay

## 2021-11-10 DIAGNOSIS — J0100 Acute maxillary sinusitis, unspecified: Secondary | ICD-10-CM | POA: Diagnosis not present

## 2021-11-10 DIAGNOSIS — Z1152 Encounter for screening for COVID-19: Secondary | ICD-10-CM | POA: Insufficient documentation

## 2021-11-10 DIAGNOSIS — R059 Cough, unspecified: Secondary | ICD-10-CM | POA: Diagnosis present

## 2021-11-10 LAB — RESP PANEL BY RT-PCR (FLU A&B, COVID) ARPGX2
Influenza A by PCR: NEGATIVE
Influenza B by PCR: NEGATIVE
SARS Coronavirus 2 by RT PCR: NEGATIVE

## 2021-11-10 LAB — GROUP A STREP BY PCR: Group A Strep by PCR: NOT DETECTED

## 2021-11-10 MED ORDER — ACETAMINOPHEN 500 MG PO TABS
1000.0000 mg | ORAL_TABLET | Freq: Once | ORAL | Status: AC
  Administered 2021-11-10: 1000 mg via ORAL
  Filled 2021-11-10: qty 2

## 2021-11-10 MED ORDER — AMOXICILLIN-POT CLAVULANATE 875-125 MG PO TABS
1.0000 | ORAL_TABLET | Freq: Once | ORAL | Status: AC
  Administered 2021-11-10: 1 via ORAL
  Filled 2021-11-10: qty 1

## 2021-11-10 MED ORDER — ONDANSETRON 4 MG PO TBDP: ORAL_TABLET | ORAL | 0 refills | Status: DC

## 2021-11-10 MED ORDER — OXYCODONE HCL 5 MG PO TABS
5.0000 mg | ORAL_TABLET | Freq: Once | ORAL | Status: AC
  Administered 2021-11-10: 5 mg via ORAL
  Filled 2021-11-10: qty 1

## 2021-11-10 MED ORDER — BENZONATATE 100 MG PO CAPS: 100.0000 mg | ORAL_CAPSULE | Freq: Three times a day (TID) | ORAL | 0 refills | Status: AC

## 2021-11-10 MED ORDER — AMOXICILLIN-POT CLAVULANATE 875-125 MG PO TABS: 1.0000 | ORAL_TABLET | Freq: Two times a day (BID) | ORAL | 0 refills | Status: AC

## 2021-11-10 NOTE — ED Provider Notes (Signed)
[place] EMERGENCY DEPARTMENT Provider Note   CSN: [id number] Arrival date & time: 11/10/21  1514     History  Chief Complaint  Patient presents with   Shortness of Breath    Alicia Becker is a 31 y.o. female.  31 yo F with a chief complaints of cough congestion has been going on for about a month now.  She had some nausea and vomiting at the onset which has resolved.  Feels like she has difficulty breathing especially when she lays back.  Pain with coughing.  Feels like she has trouble breathing when she gets up and tries to walk around.   Shortness of Breath      Home Medications Prior to Admission medications   Medication Sig Start Date End Date Taking? Authorizing Provider  amoxicillin-clavulanate (AUGMENTIN) 875-125 MG tablet Take 1 tablet by mouth every 12 (twelve) hours. 11/10/21  Yes Deno Etienne, DO  benzonatate (TESSALON) 100 MG capsule Take 1 capsule (100 mg total) by mouth every 8 (eight) hours. 11/10/21  Yes Deno Etienne, DO  ondansetron (ZOFRAN-ODT) 4 MG disintegrating tablet '4mg'$  ODT q4 hours prn nausea/vomit 11/10/21  Yes Deno Etienne, DO  albuterol (VENTOLIN HFA) 108 (90 Base) MCG/ACT inhaler Inhale 2 puffs into the lungs every 4 (four) hours as needed for wheezing or shortness of breath. 02/28/19   Faustino Congress, NP  amLODipine (NORVASC) 5 MG tablet Take 1 tablet (5 mg total) by mouth daily. 12/14/20   Caren Griffins, MD  diphenhydramine-acetaminophen (TYLENOL PM) 25-500 MG TABS tablet Take 1 tablet by mouth at bedtime as needed (pain/fever).    [provider]  hydrochlorothiazide (HYDRODIURIL) 25 MG tablet Take 12.5 mg by mouth daily. 12/01/20   [provider]  levETIRAcetam (KEPPRA) 1000 MG tablet Take 1,000 mg by mouth 2 (two) times daily. 12/01/20   [provider]  polyethylene glycol (MIRALAX / GLYCOLAX) 17 g packet Take 17 g by mouth 2 (two) times daily. 10/19/21   Drenda Freeze, MD      Allergies     Watermelon flavor, Tramadol, Codeine, Hydrocodone, Ibuprofen, and Toradol [ketorolac tromethamine]    Review of Systems   Review of Systems  Respiratory:  Positive for shortness of breath.     Physical Exam Updated Vital Signs BP (!) 156/112   Pulse 86   Temp 98.2 F (36.8 C) (Oral)   Resp 18   Ht '5\' 3"'$  (1.6 m)   Wt (!) 142.9 kg   LMP 09/19/2021 (Approximate)   SpO2 100%   BMI 55.80 kg/m  Physical Exam Vitals and nursing note reviewed.  Constitutional:      General: She is not in acute distress.    Appearance: She is well-developed. She is not diaphoretic.     Comments: BMI 56  HENT:     Head: Normocephalic and atraumatic.     Comments: Swollen turbinates, posterior nasal drip, left maxillary sinus is exquisitely tender to percussion tm normal bilaterally.   Eyes:     Pupils: Pupils are equal, round, and reactive to light.  Cardiovascular:     Rate and Rhythm: Normal rate and regular rhythm.     Heart sounds: No murmur heard.    No friction rub. No gallop.  Pulmonary:     Effort: Pulmonary effort is normal.     Breath sounds: No wheezing or rales.  Abdominal:     General: There is no distension.     Palpations: Abdomen is soft.  Tenderness: There is no abdominal tenderness.  Musculoskeletal:        General: No tenderness.     Cervical back: Normal range of motion and neck supple.  Skin:    General: Skin is warm and dry.  Neurological:     Mental Status: She is alert and oriented to person, place, and time.  Psychiatric:        Behavior: Behavior normal.     ED Results / Procedures / Treatments   Labs (all labs ordered are listed, but only abnormal results are displayed) Labs Reviewed  RESP PANEL BY RT-PCR (FLU A&B, COVID) ARPGX2  GROUP A STREP BY PCR    EKG None  Radiology No results found.  Procedures Procedures    Medications Ordered in ED Medications  acetaminophen (TYLENOL) tablet 1,000 mg (has no administration in time range)   oxyCODONE (Oxy IR/ROXICODONE) immediate release tablet 5 mg (has no administration in time range)  amoxicillin-clavulanate (AUGMENTIN) 875-125 MG per tablet 1 tablet (has no administration in time range)    ED Course/ Medical Decision Making/ A&P                           Medical Decision Making Amount and/or Complexity of Data Reviewed Radiology: ordered.  Risk OTC drugs. Prescription drug management.   31 yo F with a chief complaints of what sound like a viral syndrome cough congestion fevers chills myalgias nausea vomiting and diarrhea.  She feels like her symptoms have persisted and lasted for about a month.  She is exquisitely tender over her left maxillary sinus.  Will start on antibiotic therapy for sinusitis.  Have her follow-up with her family doctor in the office.  Chest x-ray independently interpreted by me without focal infiltrate or pneumothorax.  5:38 PM:  I have discussed the diagnosis/risks/treatment options with the patient and family.  Evaluation and diagnostic testing in the emergency department does not suggest an emergent condition requiring admission or immediate intervention beyond what has been performed at this time.  They will follow up with PCP. We also discussed returning to the ED immediately if new or worsening sx occur. We discussed the sx which are most concerning (e.g., sudden worsening pain, fever, inability to tolerate by mouth) that necessitate immediate return. Medications administered to the patient during their visit and any new prescriptions provided to the patient are listed below.  Medications given during this visit Medications  acetaminophen (TYLENOL) tablet 1,000 mg (has no administration in time range)  oxyCODONE (Oxy IR/ROXICODONE) immediate release tablet 5 mg (has no administration in time range)  amoxicillin-clavulanate (AUGMENTIN) 875-125 MG per tablet 1 tablet (has no administration in time range)     The patient appears reasonably  screen and/or stabilized for discharge and I doubt any other medical condition or other [facility] requiring further screening, evaluation, or treatment in the ED at this time prior to discharge.          Final Clinical Impression(s) / ED Diagnoses Final diagnoses:  Acute maxillary sinusitis, recurrence not specified    Rx / DC Orders ED Discharge Orders          Ordered    amoxicillin-clavulanate (AUGMENTIN) 875-125 MG tablet  Every 12 hours        11/10/21 1735    benzonatate (TESSALON) 100 MG capsule  Every 8 hours        11/10/21 1735    ondansetron (ZOFRAN-ODT) 4 MG disintegrating tablet  11/10/21 [place], [place], DO 11/10/21 1738

## 2021-11-10 NOTE — ED Notes (Signed)
Patient becomes noticeably short of breath with exertion. RR increased to about 30 with ambulation to room.

## 2021-11-10 NOTE — ED Triage Notes (Signed)
Pt states she has had a cold x 1 month. Pt states she is tested for COVID every other week at work, has been negative. Pt states she has had a fever. Pt sates she has been SHOB at night. Pt has taken OTC meds without relief.

## 2021-11-10 NOTE — Discharge Instructions (Signed)
Take tylenol 2 pills 4 times a day and motrin 4 pills 3 times a day.  Drink plenty of fluids.  Return for worsening shortness of breath, headache, confusion. Follow up with your family doctor.   

## 2022-02-08 DIAGNOSIS — Z131 Encounter for screening for diabetes mellitus: Secondary | ICD-10-CM | POA: Diagnosis not present

## 2022-02-08 DIAGNOSIS — G4733 Obstructive sleep apnea (adult) (pediatric): Secondary | ICD-10-CM | POA: Diagnosis not present

## 2022-02-08 DIAGNOSIS — Z1329 Encounter for screening for other suspected endocrine disorder: Secondary | ICD-10-CM | POA: Diagnosis not present

## 2022-02-08 DIAGNOSIS — Z1159 Encounter for screening for other viral diseases: Secondary | ICD-10-CM | POA: Diagnosis not present

## 2022-02-08 DIAGNOSIS — G4700 Insomnia, unspecified: Secondary | ICD-10-CM | POA: Diagnosis not present

## 2022-02-08 DIAGNOSIS — Z136 Encounter for screening for cardiovascular disorders: Secondary | ICD-10-CM | POA: Diagnosis not present

## 2022-02-08 DIAGNOSIS — G4730 Sleep apnea, unspecified: Secondary | ICD-10-CM | POA: Diagnosis not present

## 2022-02-08 DIAGNOSIS — Z6841 Body Mass Index (BMI) 40.0 and over, adult: Secondary | ICD-10-CM | POA: Diagnosis not present

## 2022-02-08 DIAGNOSIS — Z3202 Encounter for pregnancy test, result negative: Secondary | ICD-10-CM | POA: Diagnosis not present

## 2022-02-08 DIAGNOSIS — N912 Amenorrhea, unspecified: Secondary | ICD-10-CM | POA: Diagnosis not present

## 2022-02-08 DIAGNOSIS — L732 Hidradenitis suppurativa: Secondary | ICD-10-CM | POA: Diagnosis not present

## 2022-02-08 DIAGNOSIS — E559 Vitamin D deficiency, unspecified: Secondary | ICD-10-CM | POA: Diagnosis not present

## 2022-02-13 DIAGNOSIS — E88819 Insulin resistance, unspecified: Secondary | ICD-10-CM | POA: Diagnosis not present

## 2022-02-13 DIAGNOSIS — D509 Iron deficiency anemia, unspecified: Secondary | ICD-10-CM | POA: Diagnosis not present

## 2022-02-13 DIAGNOSIS — E1165 Type 2 diabetes mellitus with hyperglycemia: Secondary | ICD-10-CM | POA: Diagnosis not present

## 2022-02-13 DIAGNOSIS — E559 Vitamin D deficiency, unspecified: Secondary | ICD-10-CM | POA: Diagnosis not present

## 2022-02-15 ENCOUNTER — Emergency Department (HOSPITAL_COMMUNITY)
Admission: EM | Admit: 2022-02-15 | Discharge: 2022-02-15 | Disposition: A | Discharge status: Home / Self Care | Payer: 59 | Attending: Emergency Medicine | Admitting: Emergency Medicine

## 2022-02-15 ENCOUNTER — Other Ambulatory Visit: Payer: Self-pay

## 2022-02-15 ENCOUNTER — Encounter (HOSPITAL_COMMUNITY): Payer: Self-pay

## 2022-02-15 ENCOUNTER — Emergency Department (HOSPITAL_COMMUNITY): Payer: 59

## 2022-02-15 DIAGNOSIS — G40909 Epilepsy, unspecified, not intractable, without status epilepticus: Secondary | ICD-10-CM | POA: Diagnosis not present

## 2022-02-15 DIAGNOSIS — Z7984 Long term (current) use of oral hypoglycemic drugs: Secondary | ICD-10-CM | POA: Insufficient documentation

## 2022-02-15 DIAGNOSIS — R569 Unspecified convulsions: Secondary | ICD-10-CM

## 2022-02-15 DIAGNOSIS — R519 Headache, unspecified: Secondary | ICD-10-CM | POA: Diagnosis not present

## 2022-02-15 DIAGNOSIS — E119 Type 2 diabetes mellitus without complications: Secondary | ICD-10-CM | POA: Diagnosis not present

## 2022-02-15 LAB — COMPREHENSIVE METABOLIC PANEL
ALT: 13 U/L (ref 0–44)
AST: 14 U/L — ABNORMAL LOW (ref 15–41)
Albumin: 3.1 g/dL — ABNORMAL LOW (ref 3.5–5.0)
Alkaline Phosphatase: 54 U/L (ref 38–126)
Anion gap: 5 (ref 5–15)
BUN: 8 mg/dL (ref 6–20)
CO2: 29 mmol/L (ref 22–32)
Calcium: 8.7 mg/dL — ABNORMAL LOW (ref 8.9–10.3)
Chloride: 104 mmol/L (ref 98–111)
Creatinine, Ser: 0.64 mg/dL (ref 0.44–1.00)
GFR, Estimated: >60 mL/min (ref >60)
Glucose, Bld: 94 mg/dL (ref 70–99)
Potassium: 3.6 mmol/L (ref 3.5–5.1)
Sodium: 138 mmol/L (ref 135–145)
Total Bilirubin: <0.1 mg/dL — ABNORMAL LOW (ref 0.3–1.2)
Total Protein: 6.4 g/dL — ABNORMAL LOW (ref 6.5–8.1)

## 2022-02-15 LAB — CBC WITH DIFFERENTIAL/PLATELET
Abs Immature Granulocytes: 0.02 10*3/uL (ref 0.00–0.07)
Basophils Absolute: 0 10*3/uL (ref 0.0–0.1)
Basophils Relative: 0 %
Eosinophils Absolute: 0.1 10*3/uL (ref 0.0–0.5)
Eosinophils Relative: 2 %
HCT: 32.3 % — ABNORMAL LOW (ref 36.0–46.0)
Hemoglobin: 9.9 g/dL — ABNORMAL LOW (ref 12.0–15.0)
Immature Granulocytes: 0 %
Lymphocytes Relative: 36 %
Lymphs Abs: 2.7 10*3/uL (ref 0.7–4.0)
MCH: 23.8 pg — ABNORMAL LOW (ref 26.0–34.0)
MCHC: 30.7 g/dL (ref 30.0–36.0)
MCV: 77.6 fL — ABNORMAL LOW (ref 80.0–100.0)
Monocytes Absolute: 0.6 10*3/uL (ref 0.1–1.0)
Monocytes Relative: 9 %
Neutro Abs: 4 10*3/uL (ref 1.7–7.7)
Neutrophils Relative %: 53 %
Platelets: 331 10*3/uL (ref 150–400)
RBC: 4.16 MIL/uL (ref 3.87–5.11)
RDW: 17.5 % — ABNORMAL HIGH (ref 11.5–15.5)
WBC: 7.5 10*3/uL (ref 4.0–10.5)
nRBC: 0 % (ref 0.0–0.2)

## 2022-02-15 LAB — I-STAT BETA HCG BLOOD, ED (MC, WL, AP ONLY): I-stat hCG, quantitative: <5 m[IU]/mL (ref <5)

## 2022-02-15 MED ORDER — LEVETIRACETAM IN NACL 1000 MG/100ML IV SOLN
1000.0000 mg | Freq: Once | INTRAVENOUS | Status: AC
  Administered 2022-02-15: 1000 mg via INTRAVENOUS
  Filled 2022-02-15: qty 100

## 2022-02-15 MED ORDER — LEVETIRACETAM 1000 MG PO TABS: 1000.0000 mg | ORAL_TABLET | Freq: Two times a day (BID) | ORAL | 0 refills | Status: DC

## 2022-02-15 MED ORDER — MORPHINE SULFATE (PF) 4 MG/ML IV SOLN
4.0000 mg | Freq: Once | INTRAVENOUS | Status: AC
  Administered 2022-02-15: 4 mg via INTRAVENOUS
  Filled 2022-02-15: qty 1

## 2022-02-15 MED ORDER — ACETAMINOPHEN 500 MG PO TABS
1000.0000 mg | ORAL_TABLET | Freq: Once | ORAL | Status: AC
  Administered 2022-02-15: 1000 mg via ORAL
  Filled 2022-02-15: qty 2

## 2022-02-15 NOTE — ED Triage Notes (Signed)
Per EMS, Pt, from home, reports having 4-5 seizures per day "every day since the weekend."  Hx of seizures and complaint w/ Keppra.  Pt reports issues w/ her glucose is causing the seizures per her PCP.  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    Pt is currently A&Ox4.  C/o  headache and mouth pain.  Pain score 10/10.

## 2022-02-15 NOTE — Discharge Instructions (Addendum)
You have been evaluated for your symptoms.  Fortunately no concerning finding were noted on today's exam.  It is important for you to call and follow-up closely with your neurologist or with neurologist, number below, for further evaluation of your symptoms.  Resume taking Keppra in the meantime.  Do not drive or operate heavy machinery.  Return to ER if your symptoms worsen or if you have other concern.

## 2022-02-15 NOTE — ED Notes (Signed)
Pt given a cup and peri-wipe for UA and UDS.  Pt reports she forgot to pee in the cup.

## 2022-02-15 NOTE — ED Notes (Signed)
Pt reports "I was weaned off the [place] by my neurologist.  I dont know the last time I took it." EDP made aware.

## 2022-02-15 NOTE — ED Provider Notes (Signed)
[place] EMERGENCY DEPARTMENT AT [facility] Provider Note   CSN: [id number] Arrival date & time: 02/15/22  1206     History  No chief complaint on file.   Alicia Becker is a 32 y.o. female.  The history is provided by the patient and medical records. No language interpreter was used.     32 year old female significant history of diabetes, seizures on Keppra, OSA, brought here via EMS for evaluation of seizure.  Patient reports she saw a new provider last week.  States that she was told that she is borderline diabetes and we may benefit from [place].  She is waiting for approval of this medication.  She used to be on metformin but could not tolerate due to side effects.  She is not on any diabetic medication at the time.  Patient mention for the past 4 days she has had multiple episodes of seizures at least 5 times daily.  Some are witnessed some are unwitnessed.  States she feels tired from these episodes.  She mention she is compliant with her Keppra.  She denies any other medication changes any recent sickness.  She reports she quit drinking alcohol on November of last year and stopped using marijuana in January of this year.  She reports concerns about her recurrent seizure with the most recent one earlier today.  She denies tongue biting, bowel or bladder incontinence.  Home Medications Prior to Admission medications   Medication Sig Start Date End Date Taking? Authorizing Provider  albuterol (VENTOLIN HFA) 108 (90 Base) MCG/ACT inhaler Inhale 2 puffs into the lungs every 4 (four) hours as needed for wheezing or shortness of breath. 02/28/19   Moshe Cipro, NP  amLODipine (NORVASC) 5 MG tablet Take 1 tablet (5 mg total) by mouth daily. 12/14/20   Leatha Gilding, MD  amoxicillin-clavulanate (AUGMENTIN) 875-125 MG tablet Take 1 tablet by mouth every 12 (twelve) hours. 11/10/21   Melene Plan, DO  benzonatate (TESSALON) 100 MG capsule Take 1 capsule (100 mg total) by  mouth every 8 (eight) hours. 11/10/21   Melene Plan, DO  diphenhydramine-acetaminophen (TYLENOL PM) 25-500 MG TABS tablet Take 1 tablet by mouth at bedtime as needed (pain/fever).    [provider]  hydrochlorothiazide (HYDRODIURIL) 25 MG tablet Take 12.5 mg by mouth daily. 12/01/20   [provider]  levETIRAcetam (KEPPRA) 1000 MG tablet Take 1,000 mg by mouth 2 (two) times daily. 12/01/20   [provider]  ondansetron (ZOFRAN-ODT) 4 MG disintegrating tablet 4mg  ODT q4 hours prn nausea/vomit 11/10/21   Melene Plan, DO  polyethylene glycol (MIRALAX / GLYCOLAX) 17 g packet Take 17 g by mouth 2 (two) times daily. 10/19/21   Charlynne Pander, MD      Allergies    Watermelon flavor, Tramadol, Codeine, Hydrocodone, Ibuprofen, and Toradol [ketorolac tromethamine]    Review of Systems   Review of Systems  All other systems reviewed and are negative.   Physical Exam Updated Vital Signs BP (!) 120/57   Pulse 72   Temp 98.7 F (37.1 C) (Oral)   Resp 14   SpO2 100%  Physical Exam Vitals and nursing note reviewed.  Constitutional:      General: She is not in acute distress.    Appearance: She is well-developed. She is obese.  HENT:     Head: Atraumatic.  Eyes:     Conjunctiva/sclera: Conjunctivae normal.  Cardiovascular:     Rate and Rhythm: Normal rate and regular rhythm.  Pulses: Normal pulses.     Heart sounds: Normal heart sounds.  Pulmonary:     Effort: Pulmonary effort is normal.  Musculoskeletal:     Cervical back: Neck supple.     Comments: 5/5 strength to all 4 extremities  Skin:    Findings: No rash.  Neurological:     Mental Status: She is alert and oriented to person, place, and time.  Psychiatric:        Mood and Affect: Mood normal.     ED Results / Procedures / Treatments   Labs (all labs ordered are listed, but only abnormal results are displayed) Labs Reviewed  COMPREHENSIVE METABOLIC PANEL - Abnormal; Notable for the following  components:      Result Value   Calcium 8.7 (*)    Total Protein 6.4 (*)    Albumin 3.1 (*)    AST 14 (*)    Total Bilirubin <0.1 (*)    All other components within normal limits  CBC WITH DIFFERENTIAL/PLATELET - Abnormal; Notable for the following components:   Hemoglobin 9.9 (*)    HCT 32.3 (*)    MCV 77.6 (*)    MCH 23.8 (*)    RDW 17.5 (*)    All other components within normal limits  URINALYSIS, ROUTINE W REFLEX MICROSCOPIC  RAPID URINE DRUG SCREEN, HOSP PERFORMED  I-STAT BETA HCG BLOOD, ED ([facility], [facility], [facility] ONLY)    EKG None  Radiology CT Head Wo Contrast  Result Date: 02/15/2022 CLINICAL DATA:  Headache, tension type. EXAM: CT HEAD WITHOUT CONTRAST TECHNIQUE: Contiguous axial images were obtained from the base of the skull through the vertex without intravenous contrast. RADIATION DOSE REDUCTION: This exam was performed according to the departmental dose-optimization program which includes automated exposure control, adjustment of the mA and/or kV according to patient size and/or use of iterative reconstruction technique. COMPARISON:  CT head dated December 13, 2020 FINDINGS: Brain: No evidence of acute infarction, hemorrhage, hydrocephalus, extra-axial collection or mass lesion/mass effect. Vascular: No hyperdense vessel or unexpected calcification. Skull: Normal. Negative for fracture or focal lesion. Sinuses/Orbits: No acute finding. Other: None. IMPRESSION: No acute intracranial pathology. Electronically Signed   By: Larose Hires D.O.   On: 02/15/2022 14:58    Procedures Procedures    Medications Ordered in ED Medications  acetaminophen (TYLENOL) tablet 1,000 mg (1,000 mg Oral Given 02/15/22 1309)  levETIRAcetam (KEPPRA) IVPB 1000 mg/100 mL premix (1,000 mg Intravenous New Bag/Given 02/15/22 1435)  morphine (PF) 4 MG/ML injection 4 mg (4 mg Intravenous Given 02/15/22 1435)    ED Course/ Medical Decision Making/ A&P                             Medical Decision  Making Amount and/or Complexity of Data Reviewed Labs: ordered. Radiology: ordered.  Risk OTC drugs. Prescription drug management.   BP (!) 144/83 (BP Location: Right Arm)   Pulse 88   Temp 98.7 F (37.1 C) (Oral)   Resp (!) 22   SpO2 100%   24:52 PM  32 year old female significant history of diabetes, seizures on Keppra, OSA, brought here via EMS for evaluation of seizure.  Patient reports she saw a new provider last week.  States that she was told that she is borderline diabetes and we may benefit from [place].  She is waiting for approval of this medication.  She used to be on metformin but could not tolerate due to side effects.  She  is not on any diabetic medication at the time.  Patient mention for the past 4 days she has had multiple episodes of seizures at least 5 times daily.  Some are witnessed some are unwitnessed.  States she feels tired from these episodes.  She mention she is compliant with her Keppra.  She denies any other medication changes any recent sickness.  She reports she quit drinking alcohol on November of last year and stopped using marijuana in January of this year.  She reports concerns about her recurrent seizure with the most recent one earlier today.  She denies tongue biting, bowel or bladder incontinence.  On exam this is an obese female resting comfortably in bed appears to be in no acute discomfort.  She does not have any signs of tongue injury any signs of trauma.  Heart with normal rate and rhythm, lungs are clear to auscultation abdomen is soft and nontender.  Patient is alert and oriented x 4 and does not have any focal neurodeficit.  12:35 PM Initially patient reports she has been compliant with Keppra.  However she says she has been weaning off of Keppra by her neurologist and has not taken medication for quite a while.  She is currently not on any antiepileptic medication.  -Labs ordered, independently viewed and interpreted by me.  Labs remarkable  for  Hgb 9.9, similar to baseline -The patient was maintained on a cardiac monitor.  I personally viewed and interpreted the cardiac monitored which showed an underlying rhythm of: NSR -Imaging independently viewed and interpreted by me and I agree with radiologist's interpretation.  Result remarkable for head CT without acute finding -This patient presents to the ED for concern of seizure, this involves an extensive number of treatment options, and is a complaint that carries with it a high risk of complications and morbidity.  The differential diagnosis includes epileptic seizure, non epileptic seizure, osa, hypoglycemia, electrolytes derangement, syncope -Co morbidities that complicate the patient evaluation includes hx of non epileptic seizure -Treatment includes morphine, keppra, tylenol -Reevaluation of the patient after these medicines showed that the patient improved -PCP office notes or outside notes reviewed -Discussion with attending DR. Schlossman -Escalation to admission/observation considered: patients feels much better, is comfortable with discharge, and will follow up with PCP -Prescription medication considered, patient comfortable with keppra -Social Determinant of Health considered which includes tobacco use  3:10 PM Workup today is overall reassuring.  Head CT scan without any acute finding.  Labs overall reassuring no significant electrolyte derangement.  Pregnancy test is negative.  At this time, plan to prescribe patient Keppra and she can follow-up outpatient with neurology for outpatient follow-up including EEG if indicated.  Return precaution given.  Patient has been observed in the ED for the past 3 hours without any seizure activities.  She is at baseline.        Final Clinical Impression(s) / ED Diagnoses Final diagnoses:  Seizure-like activity [facility])    Rx / DC Orders ED Discharge Orders          Ordered    levETIRAcetam (KEPPRA) 1000 MG tablet  2 times  daily        02/15/22 1518              Fayrene Helper, PA-C 02/15/22 1521    Alvira Monday, MD 02/16/22 1630

## 2022-02-15 NOTE — ED Notes (Addendum)
Pt verbalized understanding of discharge instructions. Opportunity for questions provided. Pt offered a wheelchair but wants to walk out.

## 2022-02-18 DIAGNOSIS — R569 Unspecified convulsions: Secondary | ICD-10-CM | POA: Diagnosis not present

## 2022-02-19 ENCOUNTER — Emergency Department (HOSPITAL_COMMUNITY)
Admission: EM | Admit: 2022-02-19 | Discharge: 2022-02-19 | Disposition: A | Discharge status: Home / Self Care | Payer: 59 | Attending: Emergency Medicine | Admitting: Emergency Medicine

## 2022-02-19 ENCOUNTER — Emergency Department (HOSPITAL_COMMUNITY): Payer: 59

## 2022-02-19 ENCOUNTER — Encounter (HOSPITAL_COMMUNITY): Payer: Self-pay

## 2022-02-19 ENCOUNTER — Other Ambulatory Visit: Payer: Self-pay

## 2022-02-19 DIAGNOSIS — E119 Type 2 diabetes mellitus without complications: Secondary | ICD-10-CM | POA: Diagnosis not present

## 2022-02-19 DIAGNOSIS — R569 Unspecified convulsions: Secondary | ICD-10-CM | POA: Insufficient documentation

## 2022-02-19 DIAGNOSIS — Z87891 Personal history of nicotine dependence: Secondary | ICD-10-CM | POA: Insufficient documentation

## 2022-02-19 DIAGNOSIS — R0602 Shortness of breath: Secondary | ICD-10-CM | POA: Diagnosis not present

## 2022-02-19 LAB — CBC
HCT: 31.8 % — ABNORMAL LOW (ref 36.0–46.0)
Hemoglobin: 9.9 g/dL — ABNORMAL LOW (ref 12.0–15.0)
MCH: 24 pg — ABNORMAL LOW (ref 26.0–34.0)
MCHC: 31.1 g/dL (ref 30.0–36.0)
MCV: 77 fL — ABNORMAL LOW (ref 80.0–100.0)
Platelets: 343 10*3/uL (ref 150–400)
RBC: 4.13 MIL/uL (ref 3.87–5.11)
RDW: 17.8 % — ABNORMAL HIGH (ref 11.5–15.5)
WBC: 9.8 10*3/uL (ref 4.0–10.5)
nRBC: 0 % (ref 0.0–0.2)

## 2022-02-19 LAB — COMPREHENSIVE METABOLIC PANEL
ALT: 17 U/L (ref 0–44)
AST: 18 U/L (ref 15–41)
Albumin: 3.3 g/dL — ABNORMAL LOW (ref 3.5–5.0)
Alkaline Phosphatase: 62 U/L (ref 38–126)
Anion gap: 8 (ref 5–15)
BUN: 10 mg/dL (ref 6–20)
CO2: 27 mmol/L (ref 22–32)
Calcium: 9.1 mg/dL (ref 8.9–10.3)
Chloride: 102 mmol/L (ref 98–111)
Creatinine, Ser: 0.81 mg/dL (ref 0.44–1.00)
GFR, Estimated: >60 mL/min (ref >60)
Glucose, Bld: 97 mg/dL (ref 70–99)
Potassium: 3.5 mmol/L (ref 3.5–5.1)
Sodium: 137 mmol/L (ref 135–145)
Total Bilirubin: 0.2 mg/dL — ABNORMAL LOW (ref 0.3–1.2)
Total Protein: 6.8 g/dL (ref 6.5–8.1)

## 2022-02-19 LAB — CBG MONITORING, ED: Glucose-Capillary: 93 mg/dL (ref 70–99)

## 2022-02-19 LAB — PROTIME-INR
INR: 1 (ref 0.8–1.2)
Prothrombin Time: 13.4 seconds (ref 11.4–15.2)

## 2022-02-19 LAB — LACTIC ACID, PLASMA: Lactic Acid, Venous: 2.1 mmol/L (ref 0.5–1.9)

## 2022-02-19 LAB — I-STAT BETA HCG BLOOD, ED (MC, WL, AP ONLY): I-stat hCG, quantitative: <5 m[IU]/mL (ref <5)

## 2022-02-19 MED ORDER — SODIUM CHLORIDE 0.9 % IV BOLUS
1000.0000 mL | Freq: Once | INTRAVENOUS | Status: AC
  Administered 2022-02-19: 1000 mL via INTRAVENOUS

## 2022-02-19 MED ORDER — ACETAMINOPHEN 500 MG PO TABS
1000.0000 mg | ORAL_TABLET | Freq: Once | ORAL | Status: AC
  Administered 2022-02-19: 1000 mg via ORAL
  Filled 2022-02-19: qty 2

## 2022-02-19 NOTE — ED Triage Notes (Signed)
Pt BIB EMS, states pt's family called for pt seizing, has hx of seizures. States she has had 4/5 a day for 2 weeks and keppra not working

## 2022-02-19 NOTE — ED Notes (Signed)
Patient transported to CT 

## 2022-02-19 NOTE — Discharge Instructions (Signed)
You were evaluated in the Emergency Department and after careful evaluation, we did not find any emergent condition requiring admission or further testing in the hospital.  Your exam/testing today is overall reassuring.  Recommend continued follow-up with neurology.  Please return to the Emergency Department if you experience any worsening of your condition.   Thank you for allowing [place] to be a part of your care.
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
## 2022-02-19 NOTE — ED Notes (Signed)
Pt helped to bedpan, no acute distress noted. Pt call bell in reach. Denies any other needs, medicated for pain.

## 2022-02-22 DIAGNOSIS — R112 Nausea with vomiting, unspecified: Secondary | ICD-10-CM | POA: Diagnosis not present

## 2022-02-22 DIAGNOSIS — R69 Illness, unspecified: Secondary | ICD-10-CM | POA: Diagnosis not present

## 2022-02-22 DIAGNOSIS — G40909 Epilepsy, unspecified, not intractable, without status epilepticus: Secondary | ICD-10-CM | POA: Diagnosis not present

## 2022-02-28 MED FILL — Sodium Chloride IV Soln 0.9%: INTRAVENOUS | Qty: 1000 | Status: AC

## 2022-03-28 ENCOUNTER — Emergency Department (HOSPITAL_BASED_OUTPATIENT_CLINIC_OR_DEPARTMENT_OTHER): Payer: 59

## 2022-03-28 ENCOUNTER — Encounter (HOSPITAL_BASED_OUTPATIENT_CLINIC_OR_DEPARTMENT_OTHER): Payer: Self-pay | Admitting: Emergency Medicine

## 2022-03-28 ENCOUNTER — Emergency Department (HOSPITAL_BASED_OUTPATIENT_CLINIC_OR_DEPARTMENT_OTHER)
Admission: EM | Admit: 2022-03-28 | Discharge: 2022-03-28 | Disposition: A | Discharge status: Home / Self Care | Payer: 59 | Attending: Emergency Medicine | Admitting: Emergency Medicine

## 2022-03-28 ENCOUNTER — Other Ambulatory Visit: Payer: Self-pay

## 2022-03-28 DIAGNOSIS — Z79899 Other long term (current) drug therapy: Secondary | ICD-10-CM | POA: Diagnosis not present

## 2022-03-28 DIAGNOSIS — R109 Unspecified abdominal pain: Secondary | ICD-10-CM | POA: Diagnosis not present

## 2022-03-28 DIAGNOSIS — R1011 Right upper quadrant pain: Secondary | ICD-10-CM | POA: Insufficient documentation

## 2022-03-28 DIAGNOSIS — R112 Nausea with vomiting, unspecified: Secondary | ICD-10-CM | POA: Diagnosis not present

## 2022-03-28 DIAGNOSIS — E119 Type 2 diabetes mellitus without complications: Secondary | ICD-10-CM | POA: Diagnosis not present

## 2022-03-28 DIAGNOSIS — F12188 Cannabis abuse with other cannabis-induced disorder: Secondary | ICD-10-CM | POA: Diagnosis not present

## 2022-03-28 DIAGNOSIS — R509 Fever, unspecified: Secondary | ICD-10-CM | POA: Diagnosis not present

## 2022-03-28 DIAGNOSIS — R197 Diarrhea, unspecified: Secondary | ICD-10-CM | POA: Diagnosis not present

## 2022-03-28 LAB — URINALYSIS, ROUTINE W REFLEX MICROSCOPIC
Bilirubin Urine: NEGATIVE
Glucose, UA: NEGATIVE mg/dL
Hgb urine dipstick: NEGATIVE
Ketones, ur: NEGATIVE mg/dL
Leukocytes,Ua: NEGATIVE
Nitrite: NEGATIVE
Protein, ur: NEGATIVE mg/dL
Specific Gravity, Urine: >1.03 — ABNORMAL HIGH (ref 1.005–1.030)
pH: 6 (ref 5.0–8.0)

## 2022-03-28 LAB — CBC
HCT: 35.5 % — ABNORMAL LOW (ref 36.0–46.0)
Hemoglobin: 10.5 g/dL — ABNORMAL LOW (ref 12.0–15.0)
MCH: 23.1 pg — ABNORMAL LOW (ref 26.0–34.0)
MCHC: 29.6 g/dL — ABNORMAL LOW (ref 30.0–36.0)
MCV: 78.2 fL — ABNORMAL LOW (ref 80.0–100.0)
Platelets: 390 10*3/uL (ref 150–400)
RBC: 4.54 MIL/uL (ref 3.87–5.11)
RDW: 18 % — ABNORMAL HIGH (ref 11.5–15.5)
WBC: 8.4 10*3/uL (ref 4.0–10.5)
nRBC: 0 % (ref 0.0–0.2)

## 2022-03-28 LAB — RAPID URINE DRUG SCREEN, HOSP PERFORMED
Amphetamines: NOT DETECTED
Barbiturates: NOT DETECTED
Benzodiazepines: NOT DETECTED
Cocaine: NOT DETECTED
Opiates: NOT DETECTED
Tetrahydrocannabinol: POSITIVE — AB

## 2022-03-28 LAB — COMPREHENSIVE METABOLIC PANEL
ALT: 20 U/L (ref 0–44)
AST: 17 U/L (ref 15–41)
Albumin: 3.6 g/dL (ref 3.5–5.0)
Alkaline Phosphatase: 70 U/L (ref 38–126)
Anion gap: 6 (ref 5–15)
BUN: 14 mg/dL (ref 6–20)
CO2: 28 mmol/L (ref 22–32)
Calcium: 9 mg/dL (ref 8.9–10.3)
Chloride: 100 mmol/L (ref 98–111)
Creatinine, Ser: 0.7 mg/dL (ref 0.44–1.00)
GFR, Estimated: >60 mL/min (ref >60)
Glucose, Bld: 103 mg/dL — ABNORMAL HIGH (ref 70–99)
Potassium: 3.6 mmol/L (ref 3.5–5.1)
Sodium: 134 mmol/L — ABNORMAL LOW (ref 135–145)
Total Bilirubin: 0.4 mg/dL (ref 0.3–1.2)
Total Protein: 7.7 g/dL (ref 6.5–8.1)

## 2022-03-28 LAB — PREGNANCY, URINE: Preg Test, Ur: NEGATIVE

## 2022-03-28 LAB — LIPASE, BLOOD: Lipase: 35 U/L (ref 11–51)

## 2022-03-28 MED ORDER — PANTOPRAZOLE SODIUM 20 MG PO TBEC: 20.0000 mg | DELAYED_RELEASE_TABLET | Freq: Every day | ORAL | 0 refills | Status: AC

## 2022-03-28 MED ORDER — FENTANYL CITRATE PF 50 MCG/ML IJ SOSY
50.0000 ug | PREFILLED_SYRINGE | Freq: Once | INTRAMUSCULAR | Status: AC
  Administered 2022-03-28: 50 ug via INTRAVENOUS
  Filled 2022-03-28: qty 1

## 2022-03-28 MED ORDER — ONDANSETRON HCL 4 MG/2ML IJ SOLN
4.0000 mg | Freq: Once | INTRAMUSCULAR | Status: AC
  Administered 2022-03-28: 4 mg via INTRAVENOUS
  Filled 2022-03-28: qty 2

## 2022-03-28 MED ORDER — ONDANSETRON 4 MG PO TBDP: 4.0000 mg | ORAL_TABLET | Freq: Three times a day (TID) | ORAL | 0 refills | Status: AC | PRN

## 2022-03-28 MED ORDER — IOHEXOL 300 MG/ML  SOLN
125.0000 mL | Freq: Once | INTRAMUSCULAR | Status: AC | PRN
  Administered 2022-03-28: 125 mL via INTRAVENOUS

## 2022-03-28 MED ORDER — DROPERIDOL 2.5 MG/ML IJ SOLN
2.5000 mg | Freq: Once | INTRAMUSCULAR | Status: AC
  Administered 2022-03-28: 2.5 mg via INTRAVENOUS
  Filled 2022-03-28: qty 2

## 2022-03-28 MED ORDER — FAMOTIDINE 20 MG PO TABS: 20.0000 mg | ORAL_TABLET | Freq: Every day | ORAL | 0 refills | Status: AC

## 2022-03-28 MED ORDER — LACTATED RINGERS IV BOLUS
1000.0000 mL | Freq: Once | INTRAVENOUS | Status: AC
  Administered 2022-03-28: 1000 mL via INTRAVENOUS

## 2022-03-28 MED ORDER — FAMOTIDINE IN NACL 20-0.9 MG/50ML-% IV SOLN
20.0000 mg | Freq: Once | INTRAVENOUS | Status: AC
  Administered 2022-03-28: 20 mg via INTRAVENOUS
  Filled 2022-03-28: qty 50

## 2022-03-28 NOTE — ED Notes (Signed)
To CT via w/c.

## 2022-03-28 NOTE — Discharge Instructions (Addendum)
Your ultrasound, labs and CT were reassuring.  Your symptoms could be consistent with cannabis hyperemesis syndrome, additionally with your right upper quadrant pain you could have an ulcer.  We will refer you to follow-up with gastroenterology.  Recommend that you abstain from cannabis use as this could be contributing to your symptoms.

## 2022-03-28 NOTE — ED Triage Notes (Signed)
Patient presents to ED via POV from home. Here with RUQ pain since Friday. Endorses nausea, vomiting and diarrhea.

## 2022-03-28 NOTE — ED Notes (Addendum)
Pt returned from b/r, alert, NAD, calm, interactive, steady gait. Pepcid complete. Rates pain 9/10. CT here for pt. Droperidol given.

## 2022-03-28 NOTE — ED Provider Notes (Signed)
[place] EM[facility]ENC[facility] DEPARTMENT AT [facility] [place] Provider Note   CSN: [id number] Arrival date & time: 03/28/22  1719     History  Chief Complaint  Patient presents with  . Abdominal Pain    Alicia Becker is a 32 y.o. female.   Abdominal Pain Associated symptoms: nausea and vomiting      32 year old female with medical history significant for OSA, DM 2, morbid obesity, marijuana abuse who presents to the emergency department with right upper quadrant abdominal pain, nausea and vomiting.  The patient states that she has had right upper quadrant pain that has been intermittent since this past Friday.  She states the pain worsened over the last few days.  She denies any blood in her emesis.  She denies any bile.  She has had some loose stools but denies any overt diarrhea.  She endorses difficulty tolerating oral intake with right upper quadrant pain and epigastric pain.  She denies any chest pain or shortness of breath.  She had tried taking over-the-counter Pepto-Bismol and her stools turned dark.  She denies any history of gallstones or pancreatitis.  Home Medications Prior to Admission medications   Medication Sig Start Date End Date Taking? Authorizing Provider  albuterol (VENTOLIN HFA) 108 (90 Base) MCG/ACT inhaler Inhale 2 puffs into the lungs every 4 (four) hours as needed for wheezing or shortness of breath. 02/28/19   Faustino Congress, NP  amLODipine (NORVASC) 5 MG tablet Take 1 tablet (5 mg total) by mouth daily. 12/14/20   Caren Griffins, MD  amoxicillin-clavulanate (AUGMENTIN) 875-125 MG tablet Take 1 tablet by mouth every 12 (twelve) hours. 11/10/21   Deno Etienne, DO  benzonatate (TESSALON) 100 MG capsule Take 1 capsule (100 mg total) by mouth every 8 (eight) hours. 11/10/21   Deno Etienne, DO  diphenhydramine-acetaminophen (TYLENOL PM) 25-500 MG TABS tablet Take 1 tablet by mouth at bedtime as needed (pain/fever).    [provider]  hydrochlorothiazide  (HYDRODIURIL) 25 MG tablet Take 12.5 mg by mouth daily. 12/01/20   [provider]  levETIRAcetam (KEPPRA) 1000 MG tablet Take 1 tablet (1,000 mg total) by mouth 2 (two) times daily. 02/15/22   Janeece Fitting, PA-C  ondansetron (ZOFRAN-ODT) 4 MG disintegrating tablet 4mg  ODT q4 hours prn nausea/vomit 11/10/21   Deno Etienne, DO  polyethylene glycol (MIRALAX / GLYCOLAX) 17 g packet Take 17 g by mouth 2 (two) times daily. 10/19/21   Drenda Freeze, MD      Allergies    Watermelon flavor, Tramadol, Codeine, Hydrocodone, Ibuprofen, and Toradol [ketorolac tromethamine]    Review of Systems   Review of Systems  Gastrointestinal:  Positive for abdominal pain, nausea and vomiting.  All other systems reviewed and are negative.   Physical Exam Updated Vital Signs BP (!) 179/106   Pulse 81   Temp 98 F (36.7 C) (Oral)   Resp 17   SpO2 100%  Physical Exam Vitals and nursing note reviewed.  Constitutional:      General: She is not in acute distress.    Appearance: She is well-developed.     Comments: Tearful, GCS 15, ABC intact  HENT:     Head: Normocephalic and atraumatic.  Eyes:     Conjunctiva/sclera: Conjunctivae normal.  Cardiovascular:     Rate and Rhythm: Normal rate and regular rhythm.     Heart sounds: No murmur heard. Pulmonary:     Effort: Pulmonary effort is normal. No respiratory distress.     Breath sounds:  Normal breath sounds.  Abdominal:     Palpations: Abdomen is soft.     Tenderness: There is abdominal tenderness in the right upper quadrant. There is guarding. There is no rebound.  Musculoskeletal:        General: No swelling.     Cervical back: Neck supple.  Skin:    General: Skin is warm and dry.     Capillary Refill: Capillary refill takes less than 2 seconds.  Neurological:     Mental Status: She is alert.  Psychiatric:        Mood and Affect: Mood normal.    ED Results / Procedures / Treatments   Labs (all labs ordered are listed, but only  abnormal results are displayed) Labs Reviewed  COMPREHENSIVE METABOLIC PANEL - Abnormal; Notable for the following components:      Result Value   Sodium 134 (*)    Glucose, Bld 103 (*)    All other components within normal limits  CBC - Abnormal; Notable for the following components:   Hemoglobin 10.5 (*)    HCT 35.5 (*)    MCV 78.2 (*)    MCH 23.1 (*)    MCHC 29.6 (*)    RDW 18.0 (*)    All other components within normal limits  LIPASE, BLOOD  URINALYSIS, ROUTINE W REFLEX MICROSCOPIC  PREGNANCY, URINE    EKG None  Radiology No results found.  Procedures Procedures    Medications Ordered in ED Medications - No data to display  ED Course/ Medical Decision Making/ A&P                             Medical Decision Making Amount and/or Complexity of Data Reviewed Labs: ordered. Radiology: ordered.  Risk Prescription drug management.   ***  {Document critical care time when appropriate:1} {Document review of labs and clinical decision tools ie heart score, Chads2Vasc2 etc:1}  {Document your independent review of radiology images, and any outside records:1} {Document your discussion with family members, caretakers, and with consultants:1} {Document social determinants of health affecting pt's care:1} {Document your decision making why or why not admission, treatments were needed:1} Final Clinical Impression(s) / ED Diagnoses Final diagnoses:  None    Rx / DC Orders ED Discharge Orders     None

## 2022-05-29 ENCOUNTER — Emergency Department (HOSPITAL_COMMUNITY): Payer: 59

## 2022-05-29 ENCOUNTER — Emergency Department (HOSPITAL_COMMUNITY)
Admission: EM | Admit: 2022-05-29 | Discharge: 2022-05-29 | Disposition: A | Discharge status: Home / Self Care | Payer: 59 | Attending: Emergency Medicine | Admitting: Emergency Medicine

## 2022-05-29 ENCOUNTER — Encounter (HOSPITAL_COMMUNITY): Payer: Self-pay

## 2022-05-29 ENCOUNTER — Other Ambulatory Visit: Payer: Self-pay

## 2022-05-29 DIAGNOSIS — G4089 Other seizures: Secondary | ICD-10-CM | POA: Diagnosis not present

## 2022-05-29 DIAGNOSIS — S70311A Abrasion, right thigh, initial encounter: Secondary | ICD-10-CM | POA: Insufficient documentation

## 2022-05-29 DIAGNOSIS — S0990XA Unspecified injury of head, initial encounter: Secondary | ICD-10-CM | POA: Diagnosis not present

## 2022-05-29 DIAGNOSIS — R519 Headache, unspecified: Secondary | ICD-10-CM | POA: Diagnosis not present

## 2022-05-29 DIAGNOSIS — R569 Unspecified convulsions: Secondary | ICD-10-CM

## 2022-05-29 DIAGNOSIS — S61512A Laceration without foreign body of left wrist, initial encounter: Secondary | ICD-10-CM | POA: Insufficient documentation

## 2022-05-29 DIAGNOSIS — G40901 Epilepsy, unspecified, not intractable, with status epilepticus: Secondary | ICD-10-CM | POA: Diagnosis not present

## 2022-05-29 DIAGNOSIS — W19XXXA Unspecified fall, initial encounter: Secondary | ICD-10-CM | POA: Insufficient documentation

## 2022-05-29 DIAGNOSIS — R258 Other abnormal involuntary movements: Secondary | ICD-10-CM | POA: Insufficient documentation

## 2022-05-29 DIAGNOSIS — M542 Cervicalgia: Secondary | ICD-10-CM | POA: Insufficient documentation

## 2022-05-29 DIAGNOSIS — R102 Pelvic and perineal pain: Secondary | ICD-10-CM | POA: Diagnosis not present

## 2022-05-29 DIAGNOSIS — R9431 Abnormal electrocardiogram [ECG] [EKG]: Secondary | ICD-10-CM | POA: Diagnosis not present

## 2022-05-29 DIAGNOSIS — R079 Chest pain, unspecified: Secondary | ICD-10-CM | POA: Diagnosis not present

## 2022-05-29 DIAGNOSIS — Z043 Encounter for examination and observation following other accident: Secondary | ICD-10-CM | POA: Diagnosis not present

## 2022-05-29 DIAGNOSIS — I10 Essential (primary) hypertension: Secondary | ICD-10-CM | POA: Diagnosis not present

## 2022-05-29 LAB — CBC WITH DIFFERENTIAL/PLATELET
Abs Immature Granulocytes: 0.05 10*3/uL (ref 0.00–0.07)
Basophils Absolute: 0 10*3/uL (ref 0.0–0.1)
Basophils Relative: 0 %
Eosinophils Absolute: 0.1 10*3/uL (ref 0.0–0.5)
Eosinophils Relative: 2 %
HCT: 33.3 % — ABNORMAL LOW (ref 36.0–46.0)
Hemoglobin: 9.9 g/dL — ABNORMAL LOW (ref 12.0–15.0)
Immature Granulocytes: 1 %
Lymphocytes Relative: 40 %
Lymphs Abs: 3.1 10*3/uL (ref 0.7–4.0)
MCH: 23.2 pg — ABNORMAL LOW (ref 26.0–34.0)
MCHC: 29.7 g/dL — ABNORMAL LOW (ref 30.0–36.0)
MCV: 78.2 fL — ABNORMAL LOW (ref 80.0–100.0)
Monocytes Absolute: 0.6 10*3/uL (ref 0.1–1.0)
Monocytes Relative: 7 %
Neutro Abs: 3.9 10*3/uL (ref 1.7–7.7)
Neutrophils Relative %: 50 %
Platelets: 288 10*3/uL (ref 150–400)
RBC: 4.26 MIL/uL (ref 3.87–5.11)
RDW: 18.9 % — ABNORMAL HIGH (ref 11.5–15.5)
WBC: 7.7 10*3/uL (ref 4.0–10.5)
nRBC: 0 % (ref 0.0–0.2)

## 2022-05-29 LAB — BASIC METABOLIC PANEL
Anion gap: 7 (ref 5–15)
BUN: 13 mg/dL (ref 6–20)
CO2: 26 mmol/L (ref 22–32)
Calcium: 8.7 mg/dL — ABNORMAL LOW (ref 8.9–10.3)
Chloride: 105 mmol/L (ref 98–111)
Creatinine, Ser: 0.36 mg/dL — ABNORMAL LOW (ref 0.44–1.00)
GFR, Estimated: >60 mL/min (ref >60)
Glucose, Bld: 96 mg/dL (ref 70–99)
Potassium: 3.5 mmol/L (ref 3.5–5.1)
Sodium: 138 mmol/L (ref 135–145)

## 2022-05-29 LAB — I-STAT BETA HCG BLOOD, ED (MC, WL, AP ONLY): I-stat hCG, quantitative: <5 m[IU]/mL (ref <5)

## 2022-05-29 LAB — CBG MONITORING, ED: Glucose-Capillary: 85 mg/dL (ref 70–99)

## 2022-05-29 MED ORDER — CYCLOBENZAPRINE HCL 10 MG PO TABS: 10.0000 mg | ORAL_TABLET | Freq: Two times a day (BID) | ORAL | 0 refills | Status: AC | PRN

## 2022-05-29 MED ORDER — LEVETIRACETAM IN NACL 1000 MG/100ML IV SOLN
1000.0000 mg | Freq: Once | INTRAVENOUS | Status: AC
  Administered 2022-05-29: 1000 mg via INTRAVENOUS
  Filled 2022-05-29: qty 100

## 2022-05-29 MED ORDER — LEVETIRACETAM 1000 MG PO TABS: 1000.0000 mg | ORAL_TABLET | Freq: Two times a day (BID) | ORAL | 0 refills | Status: AC

## 2022-05-29 MED ORDER — FENTANYL CITRATE PF 50 MCG/ML IJ SOSY: 50.0000 ug | PREFILLED_SYRINGE | Freq: Once | INTRAMUSCULAR | Status: DC

## 2022-05-29 MED ORDER — MORPHINE SULFATE (PF) 2 MG/ML IV SOLN
2.0000 mg | Freq: Once | INTRAVENOUS | Status: AC
  Administered 2022-05-29: 2 mg via INTRAVENOUS
  Filled 2022-05-29: qty 1

## 2022-05-29 NOTE — ED Triage Notes (Addendum)
Pt from home via EMS with c/o multiple seizures today. Patient states she has had 5 seizures before EMS arrived today and two seizures with EMS. Per EMS grand mal lasting about 2.5 mins. Per pt, fall during one of her seizures in the shower with right hip laceration, left wrist laceration, and head pain. Per pt, has not had any Keppra since Saturday.  5mg  IV midazolam with EMS

## 2022-05-29 NOTE — ED Provider Notes (Signed)
[place] EMERGENCY DEPARTMENT AT [facility] Provider Note   CSN: [id number] Arrival date & time: 05/29/22  1646     History  Chief Complaint  Patient presents with   Seizures    Alicia Becker is a 32 y.o. female.  Patient here with seizure-like activity.  She states that she has been on Keppra by [facility] neurology she thinks.  She fell cut her left wrist on glass that was nearby.  She has not been taking her Keppra here recently.  She is having head and neck pain from the fall.  She denies any weakness or numbness or tingling.  Multiple twitching episodes with EMS where she would self resolve and be alert and oriented.  She was given Valium.  She denies any alcohol or drug use.  No recent illness.  The history is provided by the patient.       Home Medications Prior to Admission medications   Medication Sig Start Date End Date Taking? Authorizing Provider  cyclobenzaprine (FLEXERIL) 10 MG tablet Take 1 tablet (10 mg total) by mouth 2 (two) times daily as needed for muscle spasms. 05/29/22  Yes Navayah Sok, DO  albuterol (VENTOLIN HFA) 108 (90 Base) MCG/ACT inhaler Inhale 2 puffs into the lungs every 4 (four) hours as needed for wheezing or shortness of breath. 02/28/19   Moshe Cipro, NP  amLODipine (NORVASC) 5 MG tablet Take 1 tablet (5 mg total) by mouth daily. 12/14/20   Leatha Gilding, MD  amoxicillin-clavulanate (AUGMENTIN) 875-125 MG tablet Take 1 tablet by mouth every 12 (twelve) hours. 11/10/21   Melene Plan, DO  benzonatate (TESSALON) 100 MG capsule Take 1 capsule (100 mg total) by mouth every 8 (eight) hours. 11/10/21   Melene Plan, DO  diphenhydramine-acetaminophen (TYLENOL PM) 25-500 MG TABS tablet Take 1 tablet by mouth at bedtime as needed (pain/fever).    [provider]  famotidine (PEPCID) 20 MG tablet Take 1 tablet (20 mg total) by mouth daily. 03/28/22 04/27/22  Ernie Avena, MD  hydrochlorothiazide (HYDRODIURIL) 25 MG tablet Take 12.5  mg by mouth daily. 12/01/20   [provider]  levETIRAcetam (KEPPRA) 1000 MG tablet Take 1 tablet (1,000 mg total) by mouth 2 (two) times daily. 05/29/22   Tyler Cubit, DO  ondansetron (ZOFRAN-ODT) 4 MG disintegrating tablet Take 1 tablet (4 mg total) by mouth every 8 (eight) hours as needed. 03/28/22   Ernie Avena, MD  pantoprazole (PROTONIX) 20 MG tablet Take 1 tablet (20 mg total) by mouth daily. 03/28/22 04/27/22  Ernie Avena, MD  polyethylene glycol (MIRALAX / GLYCOLAX) 17 g packet Take 17 g by mouth 2 (two) times daily. 10/19/21   Charlynne Pander, MD      Allergies    Watermelon flavor, Tramadol, Codeine, Hydrocodone, Ibuprofen, and Toradol [ketorolac tromethamine]    Review of Systems   Review of Systems  Physical Exam Updated Vital Signs BP (!) 136/95   Pulse 91   Temp 98.3 F (36.8 C) (Oral)   Resp (!) 23   Ht 5\' 3"  (1.6 m)   Wt (!) 154.2 kg   LMP 05/12/2022 (Exact Date)   SpO2 100%   BMI 60.23 kg/m  Physical Exam Vitals and nursing note reviewed.  Constitutional:      General: She is not in acute distress.    Appearance: She is well-developed. She is not ill-appearing.  HENT:     Head: Normocephalic and atraumatic.     Nose: Nose normal.  Mouth/Throat:     Mouth: Mucous membranes are moist.  Eyes:     Extraocular Movements: Extraocular movements intact.     Conjunctiva/sclera: Conjunctivae normal.     Pupils: Pupils are equal, round, and reactive to light.  Cardiovascular:     Rate and Rhythm: Normal rate and regular rhythm.     Pulses: Normal pulses.     Heart sounds: Normal heart sounds. No murmur heard. Pulmonary:     Effort: Pulmonary effort is normal. No respiratory distress.     Breath sounds: Normal breath sounds.  Abdominal:     Palpations: Abdomen is soft.     Tenderness: There is no abdominal tenderness.  Musculoskeletal:        General: No swelling.     Cervical back: Normal range of motion and neck supple. No tenderness.   Skin:    General: Skin is warm and dry.     Capillary Refill: Capillary refill takes less than 2 seconds.     Comments: Small abrasion to the left wrist, small abrasion to the right thigh  Neurological:     General: No focal deficit present.     Mental Status: She is alert.     Cranial Nerves: No cranial nerve deficit.     Sensory: No sensory deficit.     Motor: No weakness.     Coordination: Coordination normal.  Psychiatric:        Mood and Affect: Mood normal.     ED Results / Procedures / Treatments   Labs (all labs ordered are listed, but only abnormal results are displayed) Labs Reviewed  CBC WITH DIFFERENTIAL/PLATELET - Abnormal; Notable for the following components:      Result Value   Hemoglobin 9.9 (*)    HCT 33.3 (*)    MCV 78.2 (*)    MCH 23.2 (*)    MCHC 29.7 (*)    RDW 18.9 (*)    All other components within normal limits  BASIC METABOLIC PANEL - Abnormal; Notable for the following components:   Creatinine, Ser 0.36 (*)    Calcium 8.7 (*)    All other components within normal limits  I-STAT BETA HCG BLOOD, ED ([facility], [facility], [facility] ONLY)  CBG MONITORING, ED    EKG EKG Interpretation  Date/Time:  Monday May 29 2022 16:55:30 EDT Ventricular Rate:  96 PR Interval:  153 QRS Duration: 92 QT Interval:  421 QTC Calculation: 533 R Axis:   51 Text Interpretation: Sinus rhythm Confirmed by Virgina Norfolk ([id number]) on 05/29/2022 5:01:17 PM  Radiology DG Wrist Complete Left  Result Date: 05/29/2022 CLINICAL DATA:  Fall during 1 of multiple seizures today EXAM: LEFT WRIST - COMPLETE 3+ VIEW COMPARISON:  Left wrist radiographs 04/16/2019 FINDINGS: Normal bone mineralization. Neutral ulnar variance. Joint spaces are preserved. No acute fracture is seen. No dislocation. IMPRESSION: No acute fracture. Electronically Signed   By: Neita Garnet M.D.   On: 05/29/2022 18:24   DG Pelvis Portable  Result Date: 05/29/2022 CLINICAL DATA:  Pain.  Fall during 1 of multiple seizures  today. EXAM: PORTABLE PELVIS 1-2 VIEWS COMPARISON:  KUB 10/19/2021 FINDINGS: Normal bone mineralization. The bilateral sacroiliac, bilateral femoroacetabular, and pubic symphysis joint spaces are maintained. No acute fracture or dislocation. IMPRESSION: No acute fracture. Electronically Signed   By: Neita Garnet M.D.   On: 05/29/2022 18:23   DG Chest Portable 1 View  Result Date: 05/29/2022 CLINICAL DATA:  Chest pain. Multiple seizures today. Fall during 1 of her seizures  in the shower. EXAM: PORTABLE CHEST 1 VIEW COMPARISON:  AP chest 02/19/2022 FINDINGS: Cardiac silhouette and mediastinal contours are within normal limits. The lungs are clear. No pleural effusion or pneumothorax. No acute skeletal abnormality. IMPRESSION: No active disease. Electronically Signed   By: Neita Garnet M.D.   On: 05/29/2022 18:22   CT HEAD WO CONTRAST ( )  Result Date: 05/29/2022 CLINICAL DATA:  Polytrauma, blunt.  Seizures.  Fall. EXAM: CT HEAD WITHOUT CONTRAST CT CERVICAL SPINE WITHOUT CONTRAST TECHNIQUE: Multidetector CT imaging of the head and cervical spine was performed following the standard protocol without intravenous contrast. Multiplanar CT image reconstructions of the cervical spine were also generated. RADIATION DOSE REDUCTION: This exam was performed according to the departmental dose-optimization program which includes automated exposure control, adjustment of the mA and/or kV according to patient size and/or use of iterative reconstruction technique. COMPARISON:  CT head and cervical spine 02/19/2022. FINDINGS: CT HEAD FINDINGS Brain: There is no evidence of an acute infarct, intracranial hemorrhage, mass, midline shift, or extra-axial fluid collection. The ventricles and sulci are normal. Mildly low-lying cerebellar tonsils are again noted. Vascular: No hyperdense vessel. Skull: No acute fracture or suspicious osseous lesion. Sinuses/Orbits: Paranasal sinuses and mastoid air cells are clear. Unremarkable  orbits. Other: None. CT CERVICAL SPINE FINDINGS Alignment: Mild reversal of the normal cervical lordosis. No significant listhesis in the cervical spine. Skull base and vertebrae: No acute fracture is identified, however moderate motion artifact limits assessment in the lower cervical and upper thoracic spine. Soft tissues and spinal canal: No prevertebral fluid or swelling. No visible canal hematoma. Disc levels:  Preserved disc space heights. Upper chest: Clear lung apices. Other: None. IMPRESSION: 1. No evidence of acute intracranial abnormality. 2. No acute cervical spine fracture identified within limitations of motion. Electronically Signed   By: Sebastian Ache M.D.   On: 05/29/2022 18:13   CT Cervical Spine Wo Contrast  Result Date: 05/29/2022 CLINICAL DATA:  Polytrauma, blunt.  Seizures.  Fall. EXAM: CT HEAD WITHOUT CONTRAST CT CERVICAL SPINE WITHOUT CONTRAST TECHNIQUE: Multidetector CT imaging of the head and cervical spine was performed following the standard protocol without intravenous contrast. Multiplanar CT image reconstructions of the cervical spine were also generated. RADIATION DOSE REDUCTION: This exam was performed according to the departmental dose-optimization program which includes automated exposure control, adjustment of the mA and/or kV according to patient size and/or use of iterative reconstruction technique. COMPARISON:  CT head and cervical spine 02/19/2022. FINDINGS: CT HEAD FINDINGS Brain: There is no evidence of an acute infarct, intracranial hemorrhage, mass, midline shift, or extra-axial fluid collection. The ventricles and sulci are normal. Mildly low-lying cerebellar tonsils are again noted. Vascular: No hyperdense vessel. Skull: No acute fracture or suspicious osseous lesion. Sinuses/Orbits: Paranasal sinuses and mastoid air cells are clear. Unremarkable orbits. Other: None. CT CERVICAL SPINE FINDINGS Alignment: Mild reversal of the normal cervical lordosis. No significant  listhesis in the cervical spine. Skull base and vertebrae: No acute fracture is identified, however moderate motion artifact limits assessment in the lower cervical and upper thoracic spine. Soft tissues and spinal canal: No prevertebral fluid or swelling. No visible canal hematoma. Disc levels:  Preserved disc space heights. Upper chest: Clear lung apices. Other: None. IMPRESSION: 1. No evidence of acute intracranial abnormality. 2. No acute cervical spine fracture identified within limitations of motion. Electronically Signed   By: Sebastian Ache M.D.   On: 05/29/2022 18:13    Procedures .Marland KitchenLaceration Repair  Date/Time: 05/29/2022 5:19 PM  Performed by:  Virgina Norfolk, DO Authorized by: Virgina Norfolk, DO   Consent:    Consent obtained:  Verbal   Consent given by:  Patient   Risks, benefits, and alternatives were discussed: yes     Risks discussed:  Infection, need for additional repair, nerve damage, poor wound healing, poor cosmetic result, pain, retained foreign body, tendon damage and vascular damage   Alternatives discussed:  No treatment Universal protocol:    Procedure explained and questions answered to patient or proxy's satisfaction: yes     Relevant documents present and verified: yes     Test results available: yes     Imaging studies available: yes     Patient identity confirmed:  Verbally with patient Anesthesia:    Anesthesia method:  None Laceration details:    Location: Left wrist.   Length (cm):  3   Depth (mm):  1 Pre-procedure details:    Preparation:  Patient was prepped and draped in usual sterile fashion and imaging obtained to evaluate for foreign bodies Exploration:    Limited defect created (wound extended): no     Wound exploration: wound explored through full range of motion     Wound extent: areolar tissue not violated, fascia not violated, no foreign body, no signs of injury, no nerve damage, no tendon damage, no underlying fracture and no vascular damage      Contaminated: no   Treatment:    Area cleansed with:  Shur-Clens   Amount of cleaning:  Standard Skin repair:    Repair method:  Steri-Strips and tissue adhesive   Number of Steri-Strips:  3 Approximation:    Approximation:  Close Repair type:    Repair type:  Simple Post-procedure details:    Dressing:  Open (no dressing)   Procedure completion:  Tolerated     Medications Ordered in ED Medications  levETIRAcetam (KEPPRA) IVPB 1000 mg/100 mL premix (0 mg Intravenous Stopped 05/29/22 1809)  morphine (PF) 2 MG/ML injection 2 mg (2 mg Intravenous Given 05/29/22 1754)    ED Course/ Medical Decision Making/ A&P                             Medical Decision Making Amount and/or Complexity of Data Reviewed Labs: ordered. Radiology: ordered.  Risk Prescription drug management.   Alicia Becker is here with seizure-like activity.  History of the same.  Supposedly she is on Keppra.  Per my chart review in 2022 she had EEGs with captured seizure-like activity that were not consistent with seizures.  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  They caught 2 of these episodes on EEG during a hospital admission in December 2022.  However it sounds like at some point she was started on Keppra looks like 1000 mg twice daily.  She says she saw a neurologist about a month ago and she has been on this medicine a few months.  Overall she arrives with seizure-like activity that does not appear to be true seizures.  Once I started talking to her she was immediately back to her baseline and talking to me.  She had no postictal state.  Overall seems consistent with PNES/nonepileptic seizures.  She is having some headache and neck pain from a fall.  She has some abrasions on her left arm and the back of her right leg from a glass that she says she fell on.  I washed out the wounds and repair 1 wound on  her left wrist with Dermabond and Steri-Strips.  She had had another shaking like  activity while in CT that did also not appear to be seizure activity.  She did not have a postictal state.  Overall really not quite sure was going on if this is functional.  I do not think that these are true epileptic seizures.  EEG supports that from a few years ago.  She denies any alcohol or drug use.  Ultimately at this time I will give her some Keppra here check basic labs and do some images to make sure she did not have any injuries with the CT head and neck and x-rays of her chest, pelvis, wrist.  Will continue to see if she exhibits any seizure activity.  Per my review interpretation labs no significant anemia or electrolyte abnormality or kidney injury or leukocytosis.  CT report of head and neck per radiology reports unremarkable.  X-rays per my review and interpretation and radiology report are unremarkable.  Overall back to her baseline.  Will continue Keppra and have her follow-up with neurology.  Discharged in good condition.  Will prescribe muscle relaxant as well for muscle spasms.  This chart was dictated using voice recognition software.  Despite best efforts to proofread,  errors can occur which can change the documentation meaning.         Final Clinical Impression(s) / ED Diagnoses Final diagnoses:  Seizure-like activity (HCC)    Rx / DC Orders ED Discharge Orders          Ordered    Ambulatory referral to Neurology       Comments: An appointment is requested in approximately: 2 weeks   05/29/22 1857    levETIRAcetam (KEPPRA) 1000 MG tablet  2 times daily        05/29/22 1858    cyclobenzaprine (FLEXERIL) 10 MG tablet  2 times daily PRN        05/29/22 1858              Virgina Norfolk, DO 05/29/22 1900

## 2022-05-29 NOTE — Discharge Instructions (Addendum)
Take next dose of Keppra tomorrow morning.  Keep your wound dressing on for the next 24 hours.  Then it is okay to use gentle soap and water over wound area in your left wrist.  Steri-Strips and Dermabond will fall off on their own.  Please follow-up with your neurologist.  I have prescribed you muscle relaxant to also help with may be muscle spasms from your fall.  This medication is sedating so do not mix with alcohol or drugs or dangerous activities including driving.

## 2022-06-15 ENCOUNTER — Other Ambulatory Visit: Payer: Self-pay

## 2022-06-15 ENCOUNTER — Encounter (HOSPITAL_BASED_OUTPATIENT_CLINIC_OR_DEPARTMENT_OTHER): Payer: Self-pay | Admitting: Urology

## 2022-06-15 ENCOUNTER — Emergency Department (HOSPITAL_BASED_OUTPATIENT_CLINIC_OR_DEPARTMENT_OTHER)
Admission: EM | Admit: 2022-06-15 | Discharge: 2022-06-15 | Disposition: A | Discharge status: Home / Self Care | Payer: No Typology Code available for payment source | Attending: Emergency Medicine | Admitting: Emergency Medicine

## 2022-06-15 ENCOUNTER — Emergency Department (HOSPITAL_BASED_OUTPATIENT_CLINIC_OR_DEPARTMENT_OTHER): Payer: No Typology Code available for payment source

## 2022-06-15 DIAGNOSIS — R0602 Shortness of breath: Secondary | ICD-10-CM | POA: Diagnosis not present

## 2022-06-15 DIAGNOSIS — S299XXA Unspecified injury of thorax, initial encounter: Secondary | ICD-10-CM | POA: Diagnosis not present

## 2022-06-15 DIAGNOSIS — X58XXXA Exposure to other specified factors, initial encounter: Secondary | ICD-10-CM | POA: Insufficient documentation

## 2022-06-15 DIAGNOSIS — S4991XA Unspecified injury of right shoulder and upper arm, initial encounter: Secondary | ICD-10-CM | POA: Insufficient documentation

## 2022-06-15 DIAGNOSIS — R079 Chest pain, unspecified: Secondary | ICD-10-CM | POA: Insufficient documentation

## 2022-06-15 DIAGNOSIS — E119 Type 2 diabetes mellitus without complications: Secondary | ICD-10-CM | POA: Insufficient documentation

## 2022-06-15 MED ORDER — MELOXICAM 15 MG PO TABS: 15.0000 mg | ORAL_TABLET | Freq: Every day | ORAL | 0 refills | Status: AC

## 2022-06-15 MED ORDER — ACETAMINOPHEN 500 MG PO TABS
1000.0000 mg | ORAL_TABLET | Freq: Once | ORAL | Status: AC
  Administered 2022-06-15: 1000 mg via ORAL
  Filled 2022-06-15: qty 2

## 2022-06-15 MED ORDER — ALBUTEROL SULFATE HFA 108 (90 BASE) MCG/ACT IN AERS: 2.0000 | INHALATION_SPRAY | RESPIRATORY_TRACT | Status: DC | PRN

## 2022-06-15 MED ORDER — LIDOCAINE 5 % EX PTCH
1.0000 | MEDICATED_PATCH | Freq: Once | CUTANEOUS | Status: DC
  Administered 2022-06-15: 1 via TRANSDERMAL
  Filled 2022-06-15: qty 1

## 2022-06-15 MED ORDER — LIDOCAINE 5 % EX PTCH: 1.0000 | MEDICATED_PATCH | CUTANEOUS | 0 refills | Status: AC

## 2022-06-15 NOTE — ED Notes (Signed)
Pt A&OX4 ambulatory at d/c with independent steady gait. Pt verbalized understanding of d/c instructions, prescriptions and follow up care. 

## 2022-06-15 NOTE — ED Notes (Signed)
Patient transported to X-ray 

## 2022-06-15 NOTE — ED Triage Notes (Signed)
Pt states more frequent seizures, had one Monday and since then Feels like possible injury to chest and shortness of breath  States knot and soreness to upper right chest  H/o epilepsy  on keppra 100mg 

## 2022-06-15 NOTE — Discharge Instructions (Addendum)
You were seen in the emergency department today for right shoulder pain after a fall.  As we discussed your chest x-ray and EKG looked very reassuring.  I think that you likely sprained your right shoulder when you fell.  You can sometimes have pain with deep breathing, because of the joint on the front of your shoulder connecting with your collarbone.  We have given you a sling for comfort.  You can continue taking Tylenol, and I prescribed another anti-inflammatory medicine called meloxicam that you can take daily.  He also can wear lidocaine patches over the area.  I recommend following up with the orthopedist if your symptoms persist greater than a week.  And I have attached their contact information.

## 2022-06-15 NOTE — ED Provider Notes (Signed)
[facility] Provider Note   CSN: [id number] Arrival date & time: 06/15/22  1542     History  Chief Complaint  Patient presents with   Shortness of Breath    Alicia Becker is a 32 y.o. 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 She woke up on her R side on the shower floor. Pain worse with movement and deep breathing.  She has been trying Tylenol with minimal relief.   Shortness of Breath      Home Medications Prior to Admission medications   Medication Sig Start Date End Date Taking? Authorizing Provider  lidocaine (LIDODERM) 5 % Place 1 patch onto the skin daily. Remove & Discard patch within 12 hours or as directed by MD 06/15/22  Yes Shahd Occhipinti T, PA-C  meloxicam (MOBIC) 15 MG tablet Take 1 tablet (15 mg total) by mouth daily. 06/15/22 07/15/22 Yes Makailah Slavick T, PA-C  albuterol (VENTOLIN HFA) 108 (90 Base) MCG/ACT inhaler Inhale 2 puffs into the lungs every 4 (four) hours as needed for wheezing or shortness of breath. 02/28/19   Moshe Cipro, NP  amLODipine (NORVASC) 5 MG tablet Take 1 tablet (5 mg total) by mouth daily. 12/14/20   Leatha Gilding, MD  amoxicillin-clavulanate (AUGMENTIN) 875-125 MG tablet Take 1 tablet by mouth every 12 (twelve) hours. 11/10/21   Melene Plan, DO  benzonatate (TESSALON) 100 MG capsule Take 1 capsule (100 mg total) by mouth every 8 (eight) hours. 11/10/21   Melene Plan, DO  cyclobenzaprine (FLEXERIL) 10 MG tablet Take 1 tablet (10 mg total) by mouth 2 (two) times daily as needed for muscle spasms. 05/29/22   Curatolo, Adam, DO  diphenhydramine-acetaminophen (TYLENOL PM) 25-500 MG TABS tablet Take 1 tablet by mouth at bedtime as needed (pain/fever).    [provider]  famotidine (PEPCID) 20 MG tablet Take 1 tablet (20 mg total) by mouth daily. 03/28/22  04/27/22  Ernie Avena, MD  hydrochlorothiazide (HYDRODIURIL) 25 MG tablet Take 12.5 mg by mouth daily. 12/01/20   [provider]  levETIRAcetam (KEPPRA) 1000 MG tablet Take 1 tablet (1,000 mg total) by mouth 2 (two) times daily. 05/29/22   Curatolo, Adam, DO  ondansetron (ZOFRAN-ODT) 4 MG disintegrating tablet Take 1 tablet (4 mg total) by mouth every 8 (eight) hours as needed. 03/28/22   Ernie Avena, MD  pantoprazole (PROTONIX) 20 MG tablet Take 1 tablet (20 mg total) by mouth daily. 03/28/22 04/27/22  Ernie Avena, MD  polyethylene glycol (MIRALAX / GLYCOLAX) 17 g packet Take 17 g by mouth 2 (two) times daily. 10/19/21   Charlynne Pander, MD      Allergies    Watermelon flavor, Tramadol, Codeine, Hydrocodone, Ibuprofen, and Toradol [ketorolac tromethamine]    Review of Systems   Review of Systems  Musculoskeletal:  Positive for arthralgias.  All other systems reviewed and are negative.   Physical Exam Updated Vital Signs BP (!) 172/110   Pulse 83   Temp 98.4 F (36.9 C) (Oral)   Resp 18   Ht 5\' 3"  (1.6 m)   Wt (!) 154.2 kg   LMP 06/15/2022 (Exact Date)   SpO2 100%   BMI 60.22 kg/m  Physical Exam Vitals and nursing note reviewed.  Constitutional:      Appearance: Normal appearance.  HENT:  Head: Normocephalic and atraumatic.  Eyes:     Conjunctiva/sclera: Conjunctivae normal.  Cardiovascular:     Rate and Rhythm: Normal rate and regular rhythm.  Pulmonary:     Effort: Pulmonary effort is normal. No respiratory distress.     Breath sounds: Normal breath sounds.  Abdominal:     General: There is no distension.     Palpations: Abdomen is soft.     Tenderness: There is no abdominal tenderness.  Musculoskeletal:     Comments: Muscular tenderness over the right shoulder and right lateral neck. No bony deformity. Normal strength, but pain with passive and active ROM. Normal grip strength. Normal sensation.   Skin:    General: Skin is warm and dry.   Neurological:     General: No focal deficit present.     Mental Status: She is alert.     ED Results / Procedures / Treatments   Labs (all labs ordered are listed, but only abnormal results are displayed) Labs Reviewed - No data to display  EKG EKG Interpretation  Date/Time:  Thursday June 15 2022 15:54:10 EDT Ventricular Rate:  80 PR Interval:  145 QRS Duration: 78 QT Interval:  455 QTC Calculation: 525 R Axis:   52 Text Interpretation: Sinus rhythm Borderline abnrm T, anterolateral leads Prolonged QT interval Baseline wander in lead(s) V3 V4 V5 Confirmed by Vanetta Mulders [phone]) on 06/15/2022 4:45:31 PM  Radiology DG Chest 2 View  Result Date: 06/15/2022 CLINICAL DATA:  Chest injury and shortness of breath. EXAM: CHEST - 2 VIEW COMPARISON:  Radiograph 05/29/2022 FINDINGS: The cardiomediastinal contours are normal. The lungs are clear. Pulmonary vasculature is normal. No consolidation, pleural effusion, or pneumothorax. No acute osseous abnormalities are seen. IMPRESSION: Negative radiographs of the chest. Electronically Signed   By: Narda Rutherford M.D.   On: 06/15/2022 16:06    Procedures Procedures    Medications Ordered in ED Medications  lidocaine (LIDODERM) 5 % 1 patch (1 patch Transdermal Patch Applied 06/15/22 1745)  acetaminophen (TYLENOL) tablet 1,000 mg (1,000 mg Oral Given 06/15/22 1703)    ED Course/ Medical Decision Making/ A&P                             Medical Decision Making Amount and/or Complexity of Data Reviewed Radiology: ordered.   This patient is a 32 y.o. female  who presents to the ED for concern of R shoulder pain after seizure 3 days ago.   Differential diagnoses prior to evaluation: The emergent differential diagnosis includes, but is not limited to,  fracture, dislocation, ligamentous injury. This is not an exhaustive differential.   Past Medical History / Co-morbidities / Social History: Seizure like activity on keppra,  diabetes, OSA  Physical Exam: Physical exam performed. The pertinent findings include: Tearful on exam.  Muscular tenderness noted to the right shoulder and right lateral neck, without bony deformities.  Pain with passive and active ROM, but intact strength and sensation.  Lab Tests/Imaging studies: I personally interpreted labs/imaging and the pertinent results include: Chest x-ray without acute abnormalities. I agree with the radiologist interpretation.  Cardiac monitoring: EKG obtained and interpreted by my attending physician which shows: Sinus rhythm, specific T changes, prolonged QT   Medications: I ordered medication including Tylenol and lidocaine patch.  I have reviewed the patients home medicines and have made adjustments as needed.   Disposition: After consideration of the diagnostic results and the patients response to treatment, I feel  that emergency department workup does not suggest an emergent condition requiring admission or immediate intervention beyond what has been performed at this time. The plan is: Discharged to home with symptomatic management of likely right shoulder sprain after fall.  Low concern for cardiopulmonary etiology for patient's symptoms today, as all her pain is reproducible in her shoulder.  I think that she was having some shortness of breath due to some AC joint discomfort.  Given a sling, and recommended follow-up with orthopedics.. The patient is safe for discharge and has been instructed to return immediately for worsening symptoms, change in symptoms or any other concerns.  Final Clinical Impression(s) / ED Diagnoses Final diagnoses:  Injury of right shoulder, initial encounter    Rx / DC Orders ED Discharge Orders          Ordered    meloxicam (MOBIC) 15 MG tablet  Daily        06/15/22 1739    lidocaine (LIDODERM) 5 %  Every 24 hours        06/15/22 1739           Portions of this report may have been transcribed using voice  recognition software. Every effort was made to ensure accuracy; however, inadvertent computerized transcription errors may be present.    Jeanella Flattery 06/15/22 1751    Vanetta Mulders, MD 06/19/22 1210

## 2022-09-12 DIAGNOSIS — W07XXXA Fall from chair, initial encounter: Secondary | ICD-10-CM | POA: Diagnosis not present

## 2022-09-12 DIAGNOSIS — Z91148 Patient's other noncompliance with medication regimen for other reason: Secondary | ICD-10-CM | POA: Diagnosis not present

## 2022-09-12 DIAGNOSIS — G40909 Epilepsy, unspecified, not intractable, without status epilepticus: Secondary | ICD-10-CM | POA: Diagnosis not present

## 2022-09-12 DIAGNOSIS — Z79899 Other long term (current) drug therapy: Secondary | ICD-10-CM | POA: Diagnosis not present

## 2022-09-12 DIAGNOSIS — T426X6A Underdosing of other antiepileptic and sedative-hypnotic drugs, initial encounter: Secondary | ICD-10-CM | POA: Diagnosis not present

## 2022-09-12 DIAGNOSIS — G44309 Post-traumatic headache, unspecified, not intractable: Secondary | ICD-10-CM | POA: Diagnosis not present

## 2022-09-12 DIAGNOSIS — M542 Cervicalgia: Secondary | ICD-10-CM | POA: Diagnosis not present

## 2022-09-12 DIAGNOSIS — R569 Unspecified convulsions: Secondary | ICD-10-CM | POA: Diagnosis not present

## 2022-09-12 DIAGNOSIS — Z1152 Encounter for screening for COVID-19: Secondary | ICD-10-CM | POA: Diagnosis not present

## 2022-09-12 DIAGNOSIS — S00512A Abrasion of oral cavity, initial encounter: Secondary | ICD-10-CM | POA: Diagnosis not present

## 2022-09-22 DIAGNOSIS — Z87898 Personal history of other specified conditions: Secondary | ICD-10-CM | POA: Diagnosis not present

## 2022-09-22 DIAGNOSIS — E669 Obesity, unspecified: Secondary | ICD-10-CM | POA: Diagnosis not present

## 2022-09-22 DIAGNOSIS — G40919 Epilepsy, unspecified, intractable, without status epilepticus: Secondary | ICD-10-CM | POA: Diagnosis not present

## 2022-09-22 DIAGNOSIS — R569 Unspecified convulsions: Secondary | ICD-10-CM | POA: Diagnosis not present

## 2022-09-22 DIAGNOSIS — Z3202 Encounter for pregnancy test, result negative: Secondary | ICD-10-CM | POA: Diagnosis not present

## 2022-09-22 DIAGNOSIS — Z79899 Other long term (current) drug therapy: Secondary | ICD-10-CM | POA: Diagnosis not present

## 2022-09-22 DIAGNOSIS — Z6841 Body Mass Index (BMI) 40.0 and over, adult: Secondary | ICD-10-CM | POA: Diagnosis not present

## 2022-09-22 DIAGNOSIS — S0990XA Unspecified injury of head, initial encounter: Secondary | ICD-10-CM | POA: Diagnosis not present

## 2022-09-23 DIAGNOSIS — G4733 Obstructive sleep apnea (adult) (pediatric): Secondary | ICD-10-CM | POA: Diagnosis not present

## 2022-09-23 DIAGNOSIS — R531 Weakness: Secondary | ICD-10-CM | POA: Diagnosis not present

## 2022-09-23 DIAGNOSIS — G43811 Other migraine, intractable, with status migrainosus: Secondary | ICD-10-CM | POA: Diagnosis not present

## 2022-09-23 DIAGNOSIS — F445 Conversion disorder with seizures or convulsions: Secondary | ICD-10-CM | POA: Diagnosis not present

## 2022-09-23 DIAGNOSIS — R569 Unspecified convulsions: Secondary | ICD-10-CM | POA: Diagnosis not present

## 2022-09-23 DIAGNOSIS — G894 Chronic pain syndrome: Secondary | ICD-10-CM | POA: Diagnosis not present

## 2022-12-10 DIAGNOSIS — J209 Acute bronchitis, unspecified: Secondary | ICD-10-CM | POA: Diagnosis not present

## 2022-12-11 DIAGNOSIS — Z79899 Other long term (current) drug therapy: Secondary | ICD-10-CM | POA: Diagnosis not present

## 2022-12-11 DIAGNOSIS — G40909 Epilepsy, unspecified, not intractable, without status epilepticus: Secondary | ICD-10-CM | POA: Diagnosis not present

## 2022-12-11 DIAGNOSIS — R059 Cough, unspecified: Secondary | ICD-10-CM | POA: Diagnosis not present

## 2022-12-11 DIAGNOSIS — Z6841 Body Mass Index (BMI) 40.0 and over, adult: Secondary | ICD-10-CM | POA: Diagnosis not present

## 2023-01-01 DIAGNOSIS — R220 Localized swelling, mass and lump, head: Secondary | ICD-10-CM | POA: Diagnosis not present

## 2023-01-01 DIAGNOSIS — K044 Acute apical periodontitis of pulpal origin: Secondary | ICD-10-CM | POA: Diagnosis not present

## 2023-01-01 DIAGNOSIS — S60111A Contusion of right thumb with damage to nail, initial encounter: Secondary | ICD-10-CM | POA: Diagnosis not present

## 2023-01-01 DIAGNOSIS — Z1339 Encounter for screening examination for other mental health and behavioral disorders: Secondary | ICD-10-CM | POA: Diagnosis not present

## 2023-01-01 DIAGNOSIS — K0889 Other specified disorders of teeth and supporting structures: Secondary | ICD-10-CM | POA: Diagnosis not present

## 2023-01-01 DIAGNOSIS — Z1331 Encounter for screening for depression: Secondary | ICD-10-CM | POA: Diagnosis not present

## 2023-01-01 DIAGNOSIS — M25531 Pain in right wrist: Secondary | ICD-10-CM | POA: Diagnosis not present

## 2023-01-12 DIAGNOSIS — R7303 Prediabetes: Secondary | ICD-10-CM | POA: Diagnosis not present

## 2023-11-06 IMAGING — MR MR HEAD WO/W CM
10 of 13 series · 22 of 48 positions shown · IV contrast (10 gad)
Comparison: Head CT December 13, 2020.

CLINICAL DATA: Seizure, new onset.  No history of trauma.

EXAM:
MRI HEAD WITHOUT AND WITH CONTRAST
TECHNIQUE: Multiplanar, multiecho pulse sequences of the brain and surrounding
structures were obtained without and with intravenous contrast.
CONTRAST:  10mL GADAVIST GADOBUTROL 1 MMOL/ML IV SOLN

[Series 2: DWI · axial · 3.0mm · 0.94mm/px · z∈[-82,+64]mm · 5 of 100 slices shown (1 of 2)]
[im 1/100]
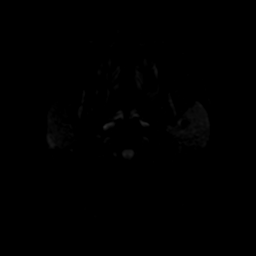
[im 25/100]
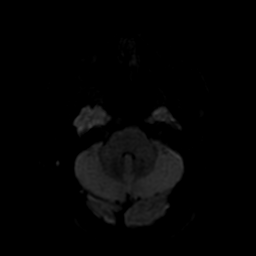
[im 50/100]
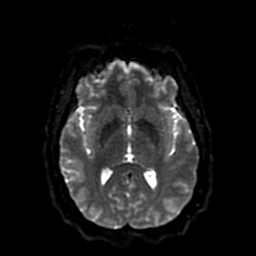
[im 75/100]
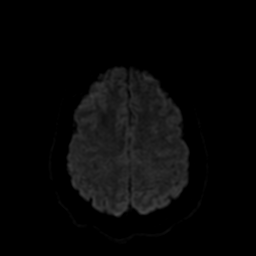
[im 100/100]
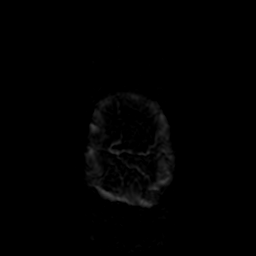

[Series 3: FLAIR · sagittal · 5.0mm · 0.23mm/px · 1 of 25 slices shown (1 of 3)]
[im 1/25]
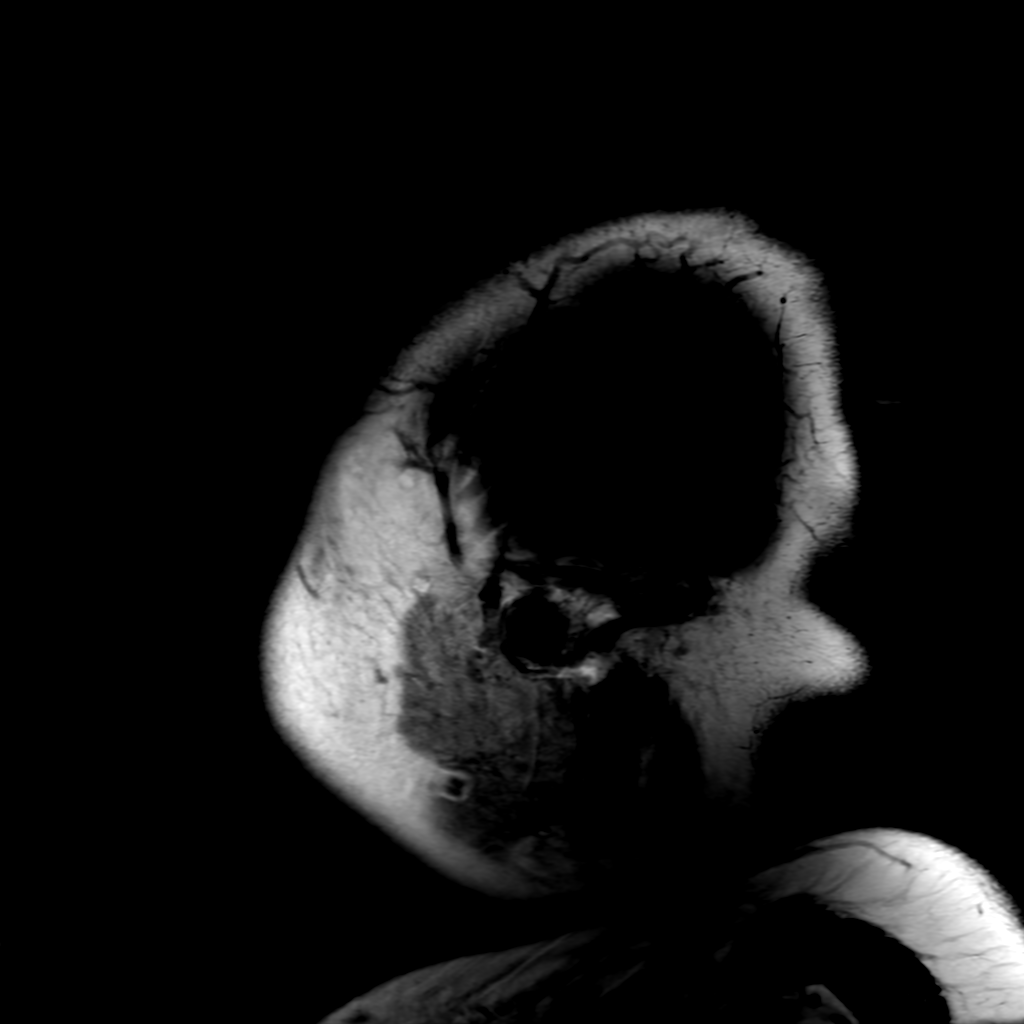

[Series 4: T2 · axial · 5.0mm · 0.23mm/px · 1 of 26 slices shown (1 of 2)]
[im 1/26]
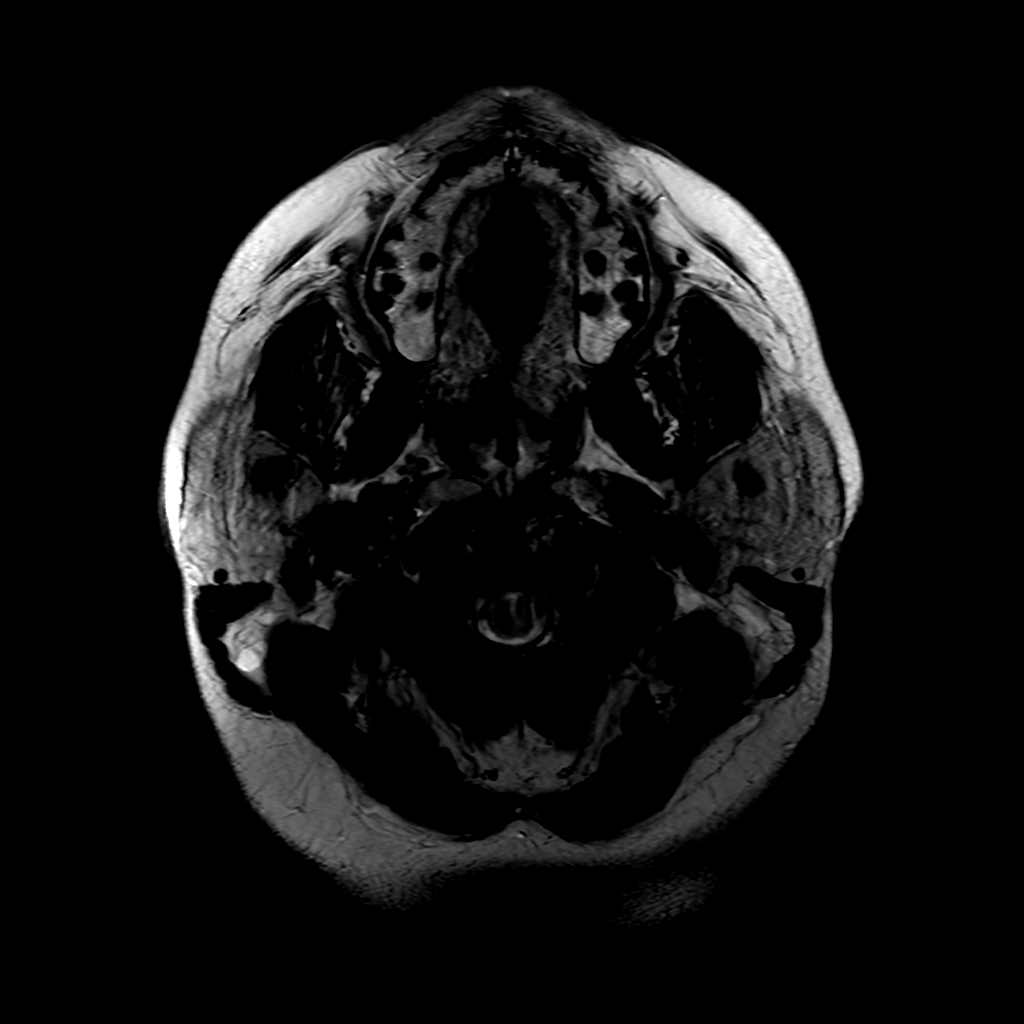

[Series 5: FLAIR · axial · 3.0mm · 0.47mm/px · 1 of 25 slices shown (2 of 3)]
[im 1/25]
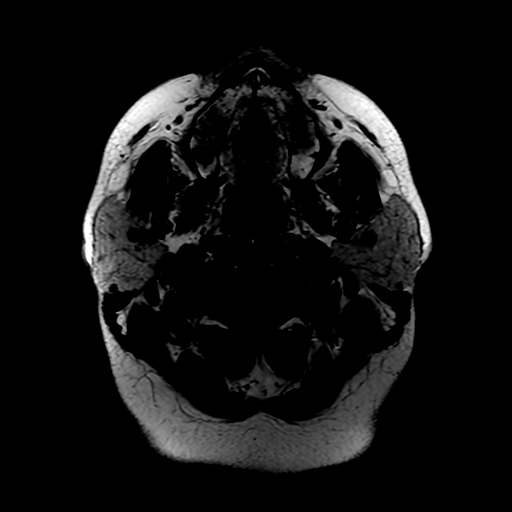

[Series 6: (person_name) · axial · 2.9mm · 0.47mm/px · z∈[-77,+19]mm · 3 of 98 slices shown]
[im 1/98]
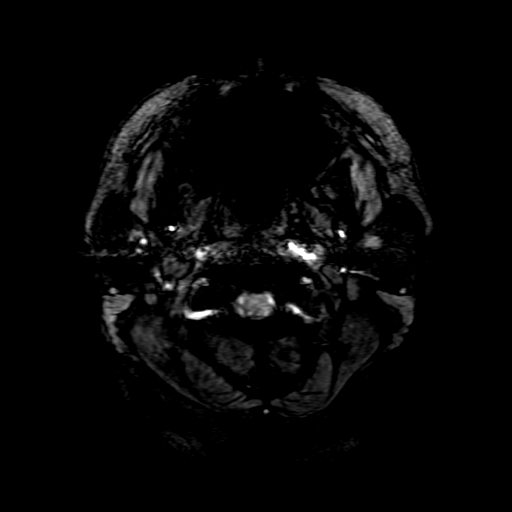
[im 33/98]
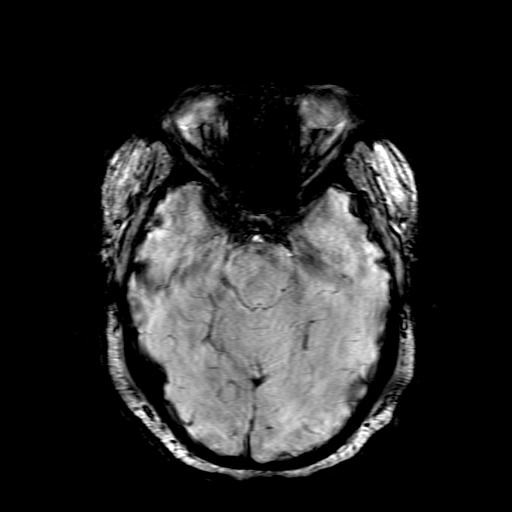
[im 65/98]
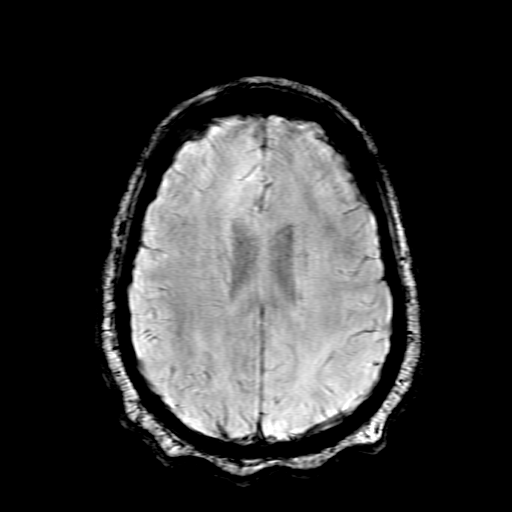

[Series 8: T2 · coronal · 3.0mm · 0.35mm/px · 2 of 38 slices shown (2 of 2)]
[im 1/38]
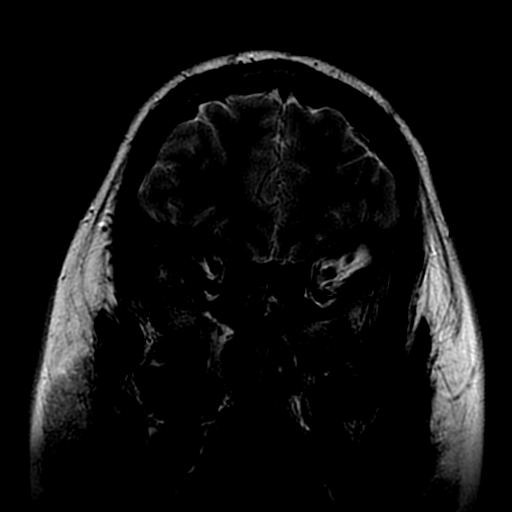
[im 38/38]
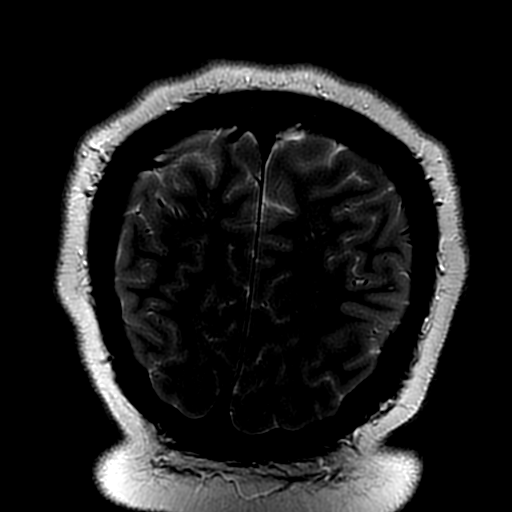

[Series 9: FLAIR · coronal · 3.0mm · 0.39mm/px · 2 of 38 slices shown (3 of 3)]
[im 1/38]
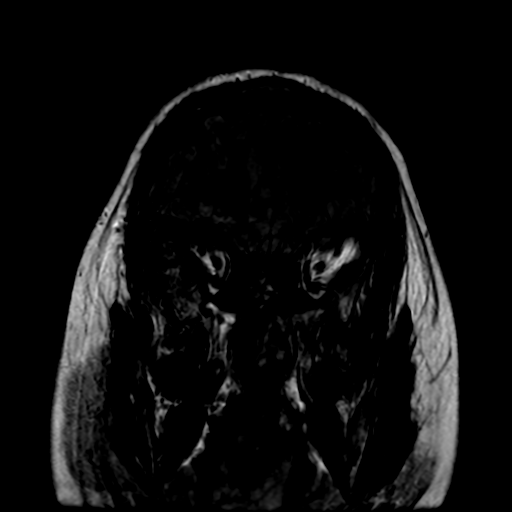
[im 38/38]
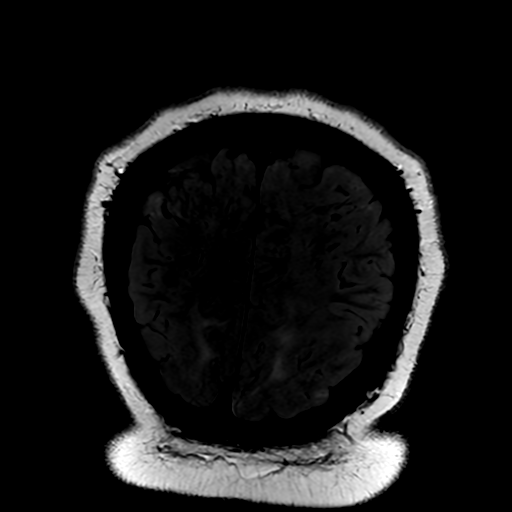

[Series 11: T1 post-contrast · coronal · 5.0mm · 0.47mm/px · 1 of 30 slices shown]
[im 1/30]
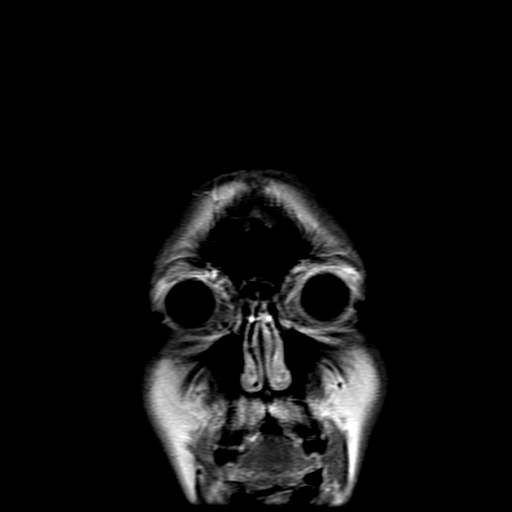

[Series 210: DWI · axial · 3.0mm · 0.94mm/px · z∈[-82,+64]mm · 4 of 100 slices shown (2 of 2)]
[im 1/100]
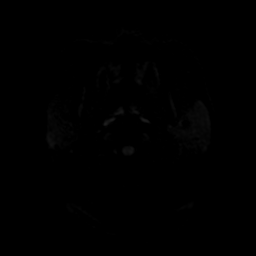
[im 34/100]
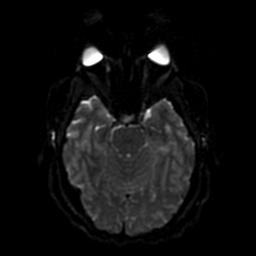
[im 67/100]
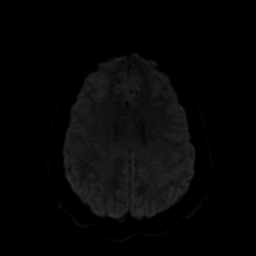
[im 100/100]
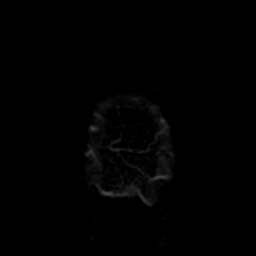

[Series 250: ADC · axial · 3.0mm · 0.94mm/px · z∈[-82,+64]mm · 2 of 50 slices shown]
[im 1/50]
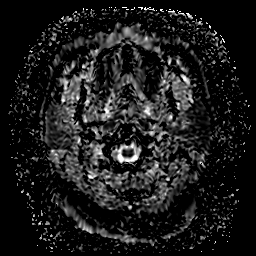
[im 50/50]
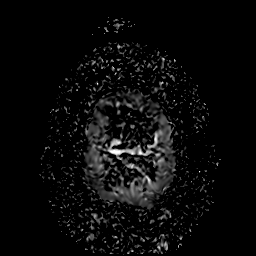

[22 of 48 positions shown; findings below may reference images not displayed]

FINDINGS: Brain: No acute infarction, hemorrhage, hydrocephalus, extra-axial
collection or mass lesion. Low lying cerebellar tonsils. The brain
parenchyma has normal signal characteristics. No focus of abnormal
contrast enhancement. Mesial temporal lobes are symmetric with
normal signal characteristics.

Vascular: Normal flow voids.

Skull and upper cervical spine: Low T1 signal in the visualized
spine and clivus, may represent red marrow reconversion in the
setting of anemia.

Sinuses/Orbits: Negative.

Other: Mild bilateral mastoid effusion.
IMPRESSION: Unremarkable MRI of the brain.

## 2023-11-06 IMAGING — CT CT HEAD W/O CM
4 series · 15 of 47 positions shown, 17 images · non-contrast
Comparison: December 10, 2020.

CLINICAL DATA: Seizure.

EXAM:
CT HEAD WITHOUT CONTRAST
CT CERVICAL SPINE WITHOUT CONTRAST
TECHNIQUE: Multidetector CT imaging of the head and cervical spine was
performed following the standard protocol without intravenous
contrast. Multiplanar CT image reconstructions of the cervical spine
were also generated.

[Series 3: head without cor · coronal · non-contrast · 0.29mm/px · 3 of 71 slices shown]
[im 24/71  brain]
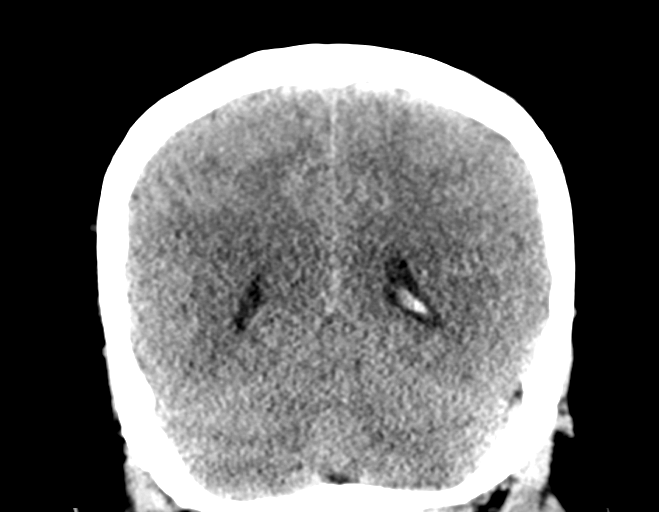
[im 32/71  brain]
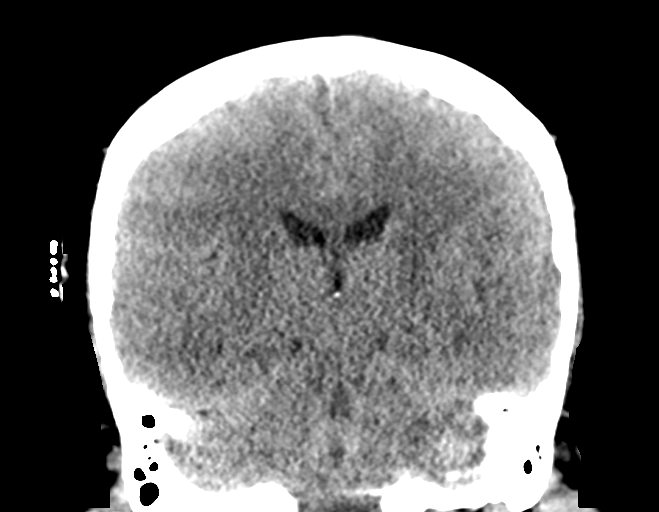
[im 39/71  brain]
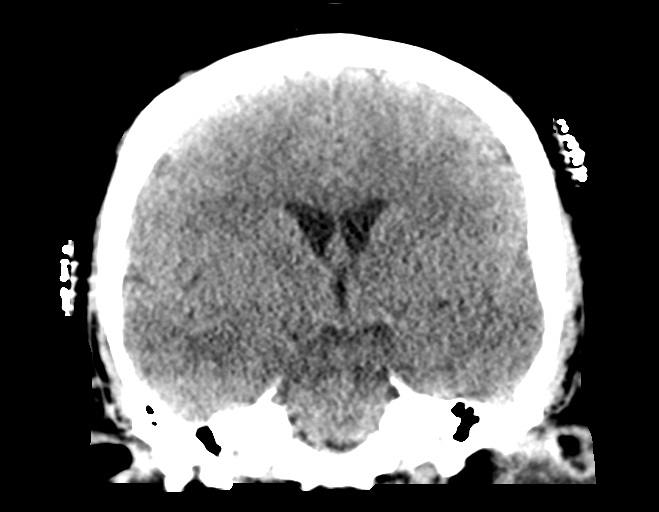

[Series 4: head bone · axial · 0.47mm/px · z∈[-229,-213]mm · 2 of 82 slices shown]
[im 9/82  bone]
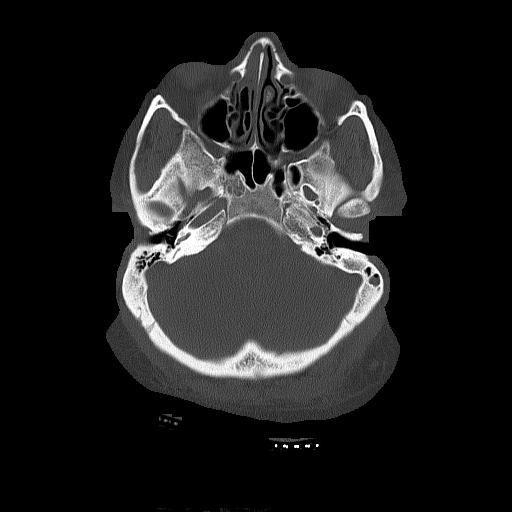
[im 17/82  bone]
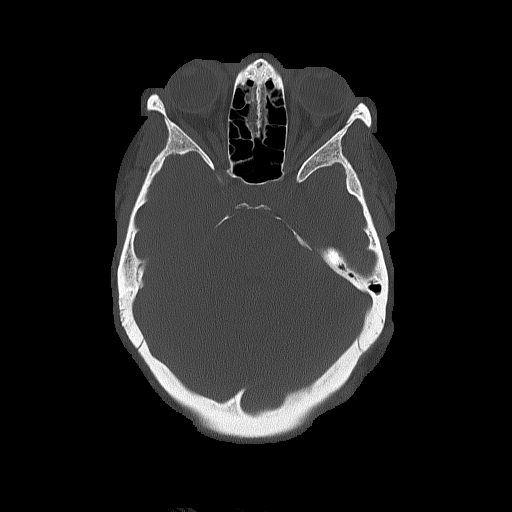

[Series 5: head without · axial · non-contrast · 0.47mm/px · z∈[-225,-105]mm · 7 of 33 slices shown, 9 images]
[im 5/33  brain]
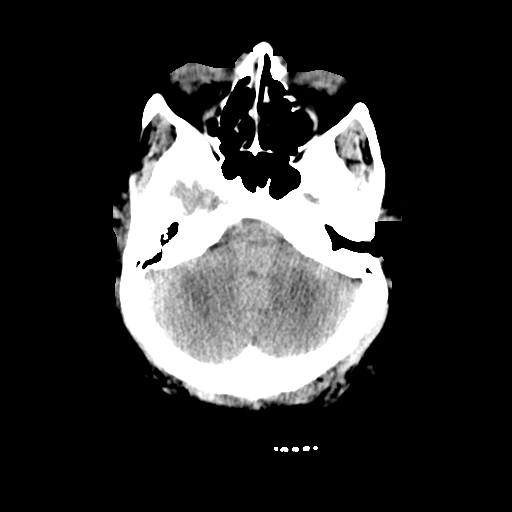
[im 5/33  bone]
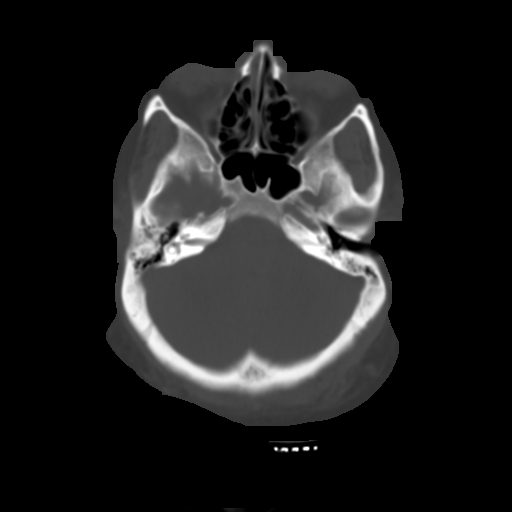
[im 9/33  brain]
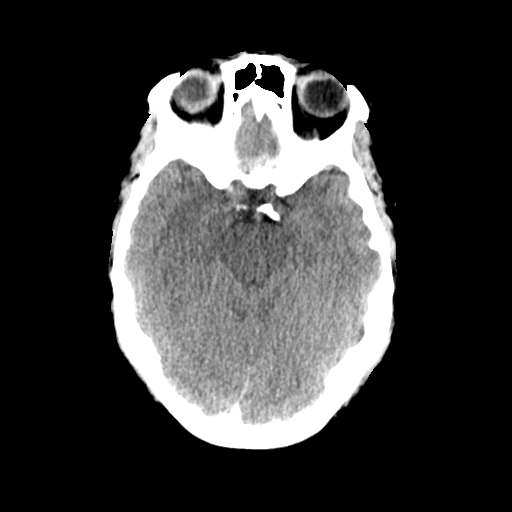
[im 13/33  brain]
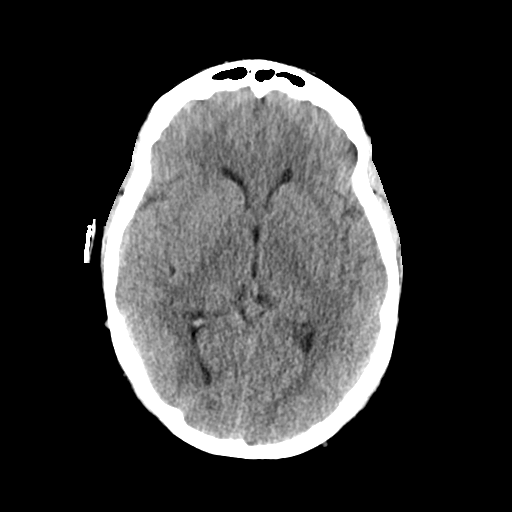
[im 17/33  brain]
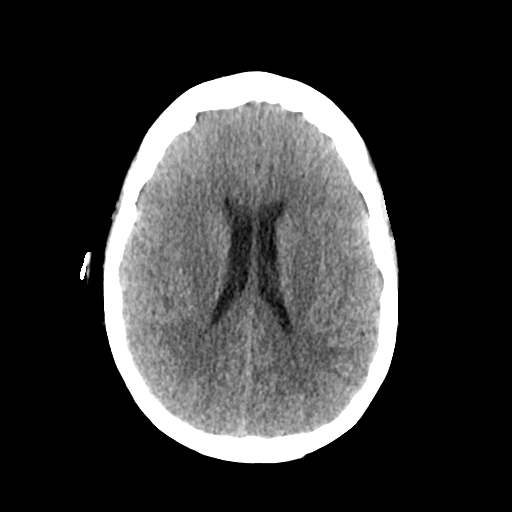
[im 21/33  brain]
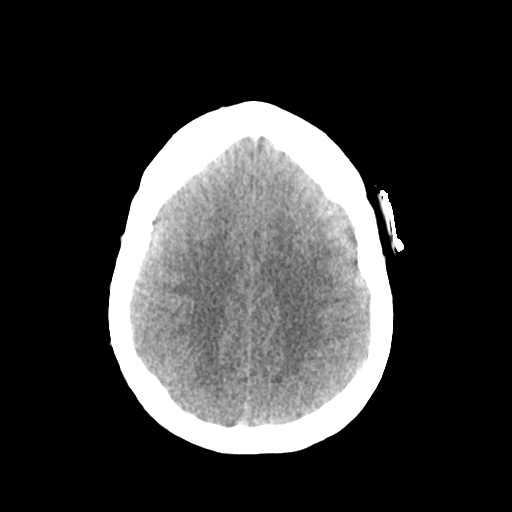
[im 21/33  bone]
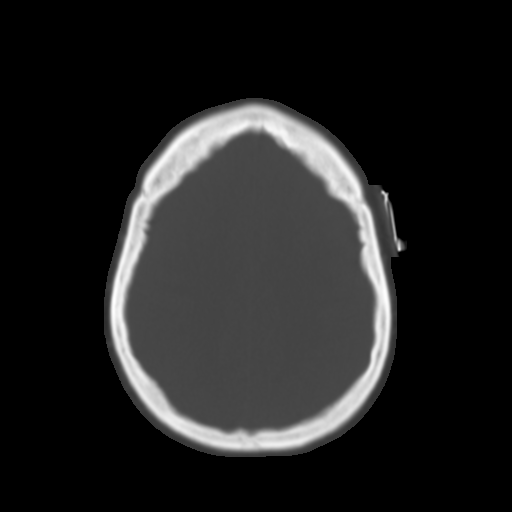
[im 25/33  brain]
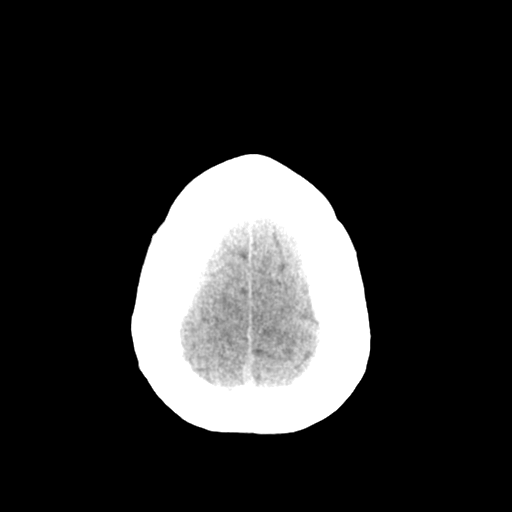
[im 29/33  brain]
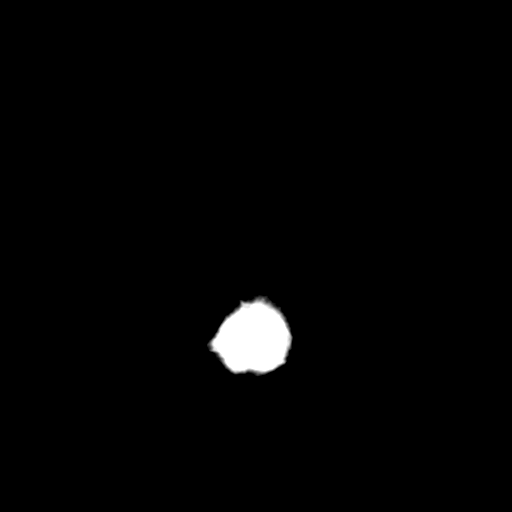

[Series 6: head without sag · sagittal · non-contrast · 0.29mm/px · 3 of 61 slices shown]
[im 21/61  brain]
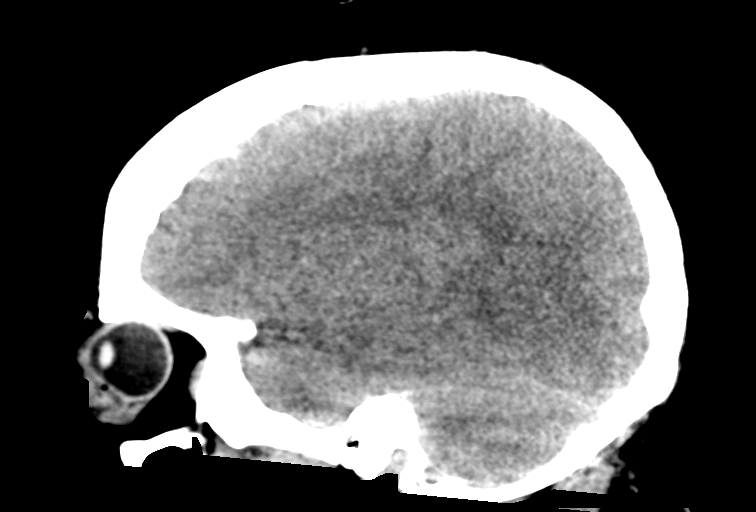
[im 31/61  brain]
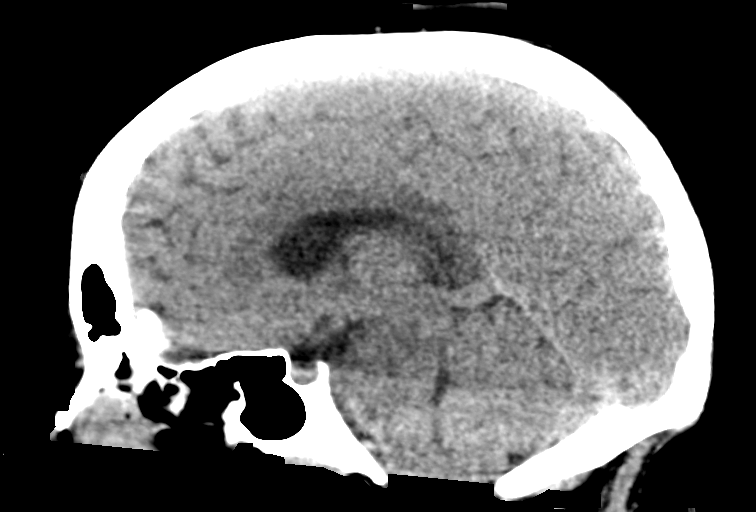
[im 41/61  brain]
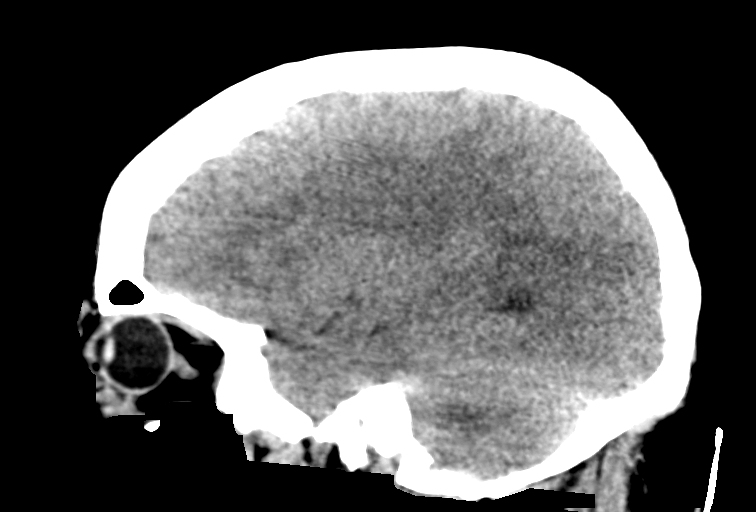

[15 of 47 positions shown; findings below may reference images not displayed]

FINDINGS: CT HEAD FINDINGS

Brain: No evidence of acute infarction, hemorrhage, hydrocephalus,
extra-axial collection or mass lesion/mass effect.

Vascular: No hyperdense vessel or unexpected calcification.

Skull: Normal. Negative for fracture or focal lesion.

Sinuses/Orbits: No acute finding.

Other: None.

CT CERVICAL SPINE FINDINGS

Alignment: Normal.

Skull base and vertebrae: No acute fracture. No primary bone lesion
or focal pathologic process.

Soft tissues and spinal canal: No prevertebral fluid or swelling. No
visible canal hematoma.

Disc levels:  Normal.

Upper chest: Negative.

Other: None.
IMPRESSION: No acute intracranial abnormality seen.

No abnormality seen in the cervical spine.
